# Patient Record
Sex: Female | Born: 1960 | Race: White | Hispanic: No | Marital: Married | Smoking: Never smoker
Health system: Southern US, Community
[De-identification: ages and names within clinical notes are randomized; demographics above are authoritative.]

## PROBLEM LIST (undated history)

## (undated) DIAGNOSIS — E538 Deficiency of other specified B group vitamins: Secondary | ICD-10-CM

## (undated) DIAGNOSIS — E039 Hypothyroidism, unspecified: Secondary | ICD-10-CM

## (undated) DIAGNOSIS — R569 Unspecified convulsions: Secondary | ICD-10-CM

## (undated) DIAGNOSIS — F419 Anxiety disorder, unspecified: Secondary | ICD-10-CM

## (undated) HISTORY — PX: COLONOSCOPY: SHX174

## (undated) HISTORY — PX: OTHER SURGICAL HISTORY: SHX169

## (undated) HISTORY — PX: WISDOM TOOTH EXTRACTION: SHX21

---

## 2015-10-30 ENCOUNTER — Other Ambulatory Visit: Payer: Self-pay | Admitting: Obstetrics and Gynecology

## 2015-10-30 DIAGNOSIS — Z1231 Encounter for screening mammogram for malignant neoplasm of breast: Secondary | ICD-10-CM

## 2015-11-09 ENCOUNTER — Ambulatory Visit: Payer: Self-pay | Attending: Obstetrics and Gynecology

## 2015-12-05 ENCOUNTER — Ambulatory Visit: Payer: Self-pay | Attending: Obstetrics and Gynecology

## 2016-01-10 ENCOUNTER — Ambulatory Visit: Payer: Commercial Managed Care - HMO | Admitting: Certified Registered Nurse Anesthetist

## 2016-01-10 ENCOUNTER — Encounter: Payer: Self-pay | Admitting: *Deleted

## 2016-01-10 ENCOUNTER — Encounter
Admission: RE | Disposition: A | Discharge status: Home / Self Care | Payer: Self-pay | Source: Ambulatory Visit | Attending: Unknown Physician Specialty

## 2016-01-10 ENCOUNTER — Ambulatory Visit
Admission: RE | Admit: 2016-01-10 | Discharge: 2016-01-10 | Disposition: A | Discharge status: Home / Self Care | Payer: Commercial Managed Care - HMO | Source: Ambulatory Visit | Attending: Unknown Physician Specialty | Admitting: Unknown Physician Specialty

## 2016-01-10 DIAGNOSIS — Z1211 Encounter for screening for malignant neoplasm of colon: Secondary | ICD-10-CM | POA: Insufficient documentation

## 2016-01-10 DIAGNOSIS — K5730 Diverticulosis of large intestine without perforation or abscess without bleeding: Secondary | ICD-10-CM | POA: Diagnosis not present

## 2016-01-10 DIAGNOSIS — E538 Deficiency of other specified B group vitamins: Secondary | ICD-10-CM | POA: Diagnosis not present

## 2016-01-10 DIAGNOSIS — K640 First degree hemorrhoids: Secondary | ICD-10-CM | POA: Insufficient documentation

## 2016-01-10 DIAGNOSIS — F419 Anxiety disorder, unspecified: Secondary | ICD-10-CM | POA: Insufficient documentation

## 2016-01-10 DIAGNOSIS — E039 Hypothyroidism, unspecified: Secondary | ICD-10-CM | POA: Insufficient documentation

## 2016-01-10 DIAGNOSIS — Z8371 Family history of colonic polyps: Secondary | ICD-10-CM | POA: Insufficient documentation

## 2016-01-10 DIAGNOSIS — Z79899 Other long term (current) drug therapy: Secondary | ICD-10-CM | POA: Diagnosis not present

## 2016-01-10 HISTORY — PX: COLONOSCOPY WITH PROPOFOL: SHX5780

## 2016-01-10 HISTORY — DX: Unspecified convulsions: R56.9

## 2016-01-10 HISTORY — DX: Anxiety disorder, unspecified: F41.9

## 2016-01-10 HISTORY — DX: Deficiency of other specified B group vitamins: E53.8

## 2016-01-10 HISTORY — DX: Hypothyroidism, unspecified: E03.9

## 2016-01-10 SURGERY — COLONOSCOPY WITH PROPOFOL
Anesthesia: General

## 2016-01-10 MED ORDER — SODIUM CHLORIDE 0.9 % IV SOLN: INTRAVENOUS | Status: DC

## 2016-01-10 MED ORDER — LIDOCAINE HCL (PF) 2 % IJ SOLN
INTRAMUSCULAR | Status: DC | PRN
  Administered 2016-01-10: 50 mg

## 2016-01-10 MED ORDER — FENTANYL CITRATE (PF) 100 MCG/2ML IJ SOLN
INTRAMUSCULAR | Status: DC | PRN
  Administered 2016-01-10: 50 ug via INTRAVENOUS

## 2016-01-10 MED ORDER — PROPOFOL 10 MG/ML IV BOLUS
INTRAVENOUS | Status: DC | PRN
  Administered 2016-01-10: 50 mg via INTRAVENOUS

## 2016-01-10 MED ORDER — MIDAZOLAM HCL 5 MG/5ML IJ SOLN
INTRAMUSCULAR | Status: DC | PRN
  Administered 2016-01-10: 1 mg via INTRAVENOUS

## 2016-01-10 MED ORDER — PROPOFOL 500 MG/50ML IV EMUL
INTRAVENOUS | Status: DC | PRN
  Administered 2016-01-10: 100 ug/kg/min via INTRAVENOUS

## 2016-01-10 MED ORDER — SODIUM CHLORIDE 0.9 % IV SOLN
INTRAVENOUS | Status: DC
  Administered 2016-01-10: 1000 mL via INTRAVENOUS
  Administered 2016-01-10: 11:00:00 via INTRAVENOUS

## 2016-01-10 NOTE — Transfer of Care (Signed)
Immediate Anesthesia Transfer of Care Note  Patient: Jackie Fleming  Procedure(s) Performed: Procedure(s): COLONOSCOPY WITH PROPOFOL (N/A)  Patient Location: Endoscopy Unit  Anesthesia Type:General  Level of Consciousness: awake and alert   Airway & Oxygen Therapy: Patient Spontanous Breathing and Patient connected to nasal cannula oxygen  Post-op Assessment: Report given to RN and Post -op Vital signs reviewed and stable  Post vital signs: Reviewed  Last Vitals:  Vitals:   01/10/16 0940 01/10/16 1128  BP: 108/74 102/60  Pulse: 71 61  Resp: 18 18  Temp: 36.8 C (!) 36 C    Last Pain:  Vitals:   01/10/16 0940  TempSrc: Tympanic         Complications: No apparent anesthesia complications

## 2016-01-10 NOTE — Anesthesia Preprocedure Evaluation (Signed)
Anesthesia Evaluation  Patient identified by MRN, date of birth, ID band Patient awake    Reviewed: Allergy & Precautions, H&P , NPO status , Patient's Chart, lab work & pertinent test results, reviewed documented beta blocker date and time   Airway Mallampati: II   Neck ROM: full    Dental  (+) Poor Dentition   Pulmonary neg pulmonary ROS,    Pulmonary exam normal        Cardiovascular negative cardio ROS Normal cardiovascular exam     Neuro/Psych Seizures -, Well Controlled,  negative neurological ROS  negative psych ROS   GI/Hepatic negative GI ROS, Neg liver ROS,   Endo/Other  negative endocrine ROSHypothyroidism   Renal/GU negative Renal ROS  negative genitourinary   Musculoskeletal   Abdominal   Peds  Hematology negative hematology ROS (+)   Anesthesia Other Findings Past Medical History: No date: Anxiety No date: B12 deficiency No date: Hypothyroidism No date: Seizures [facility]) Past Surgical History: No date: COLONOSCOPY No date: eye lift BMI    Body Mass Index:  22.31 kg/m     Reproductive/Obstetrics                             Anesthesia Physical Anesthesia Plan  ASA: II  Anesthesia Plan: General   Post-op Pain Management:    Induction:   Airway Management Planned:   Additional Equipment:   Intra-op Plan:   Post-operative Plan:   Informed Consent: I have reviewed the patients History and Physical, chart, labs and discussed the procedure including the risks, benefits and alternatives for the proposed anesthesia with the patient or authorized representative who has indicated his/her understanding and acceptance.   Dental Advisory Given  Plan Discussed with: CRNA  Anesthesia Plan Comments:         Anesthesia Quick Evaluation

## 2016-01-10 NOTE — Op Note (Signed)
[facility] Gastroenterology Patient Name: Jackie Fleming Procedure Date: 01/10/2016 10:58 AM MRN: [id number] Account #: [id number] Date of Birth: 1961/01/27 Admit Type: Outpatient Age: 55 Room: [facility] ENDO ROOM 4 Gender: Female Note Status: Finalized Procedure:            Colonoscopy Indications:          Screening for colorectal malignant neoplasm, Colon                        cancer screening in patient at increased risk: Family                        history of 1st-degree relative with colon polyps Providers:            Manya Silvas, MD Referring MD:         Ramonita Lab, MD (Referring MD) Medicines:            Propofol per Anesthesia Complications:        No immediate complications. Procedure:            Pre-Anesthesia Assessment:                       - After reviewing the risks and benefits, the patient                        was deemed in satisfactory condition to undergo the                        procedure.                       After obtaining informed consent, the colonoscope was                        passed under direct vision. Throughout the procedure,                        the patient's blood pressure, pulse, and oxygen                        saturations were monitored continuously. The                        Colonoscope was introduced through the anus and                        advanced to the the cecum, identified by appendiceal                        orifice and ileocecal valve. The colonoscopy was                        performed without difficulty. The patient tolerated the                        procedure well. The quality of the bowel preparation                        was excellent. Findings:      A single small-mouthed diverticulum was found in the proximal sigmoid  colon.      Internal hemorrhoids were found during endoscopy. The hemorrhoids were       small, medium-sized and Grade I (internal hemorrhoids that do not   prolapse).      The exam was otherwise without abnormality. Impression:           - Diverticulosis in the proximal sigmoid colon.                       - Internal hemorrhoids.                       - The examination was otherwise normal.                       - No specimens collected. Recommendation:       - Repeat colonoscopy in 5 years for screening purposes.                        Due to family history. Manya Silvas, MD 01/10/2016 11:29:23 AM This report has been signed electronically. Number of Addenda: 0 Note Initiated On: 01/10/2016 10:58 AM Scope Withdrawal Time: 0 hours 11 minutes 52 seconds  Total Procedure Duration: 0 hours 19 minutes 7 seconds       [facility]

## 2016-01-10 NOTE — H&P (Signed)
   Primary Care Physician:  Adin Hector, MD Primary Gastroenterologist:  Dr. Vira Agar  Pre-Procedure History & Physical: HPI:  Jackie Fleming is a 55 y.o. female is here for an colonoscopy.   Past Medical History:  Diagnosis Date  . Anxiety   . B12 deficiency   . Hypothyroidism   . Seizures ([place])     Past Surgical History:  Procedure Laterality Date  . COLONOSCOPY    . eye lift      Prior to Admission medications   Medication Sig Start Date End Date Taking? Authorizing Provider  atorvastatin (LIPITOR) 20 MG tablet Take 20 mg by mouth daily.   Yes Historical Provider, MD  cholecalciferol (VITAMIN D) 1000 units tablet Take 1,000 Units by mouth daily.   Yes Historical Provider, MD  escitalopram (LEXAPRO) 20 MG tablet Take 20 mg by mouth daily.   Yes Historical Provider, MD  estradiol (MINIVELLE) 0.025 MG/24HR Place 1 patch onto the skin 2 (two) times a week.   Yes Historical Provider, MD  levothyroxine (SYNTHROID, LEVOTHROID) 75 MCG tablet Take 75 mcg by mouth daily before breakfast.   Yes Historical Provider, MD  vitamin B-12 (CYANOCOBALAMIN) 100 MCG tablet Take 100 mcg by mouth daily.   Yes Historical Provider, MD    Allergies as of 01/08/2016  . (Not on File)    History reviewed. No pertinent family history.  Social History   Social History  . Marital status: Married    Spouse name: N/A  . Number of children: N/A  . Years of education: N/A   Occupational History  . Not on file.   Social History Main Topics  . Smoking status: Never Smoker  . Smokeless tobacco: Never Used  . Alcohol use No  . Drug use: No  . Sexual activity: Not on file   Other Topics Concern  . Not on file   Social History Narrative  . No narrative on file    Review of Systems: See HPI, otherwise negative ROS  Physical Exam: BP 108/74   Pulse 71   Temp 98.3 F (36.8 C) (Tympanic)   Resp 18   Ht 5\' 4"  (1.626 m)   Wt 59 kg (130 lb)   SpO2 98%   BMI 22.31 kg/m  General:    Alert,  pleasant and cooperative in NAD Head:  Normocephalic and atraumatic. Neck:  Supple; no masses or thyromegaly. Lungs:  Clear throughout to auscultation.    Heart:  Regular rate and rhythm. Abdomen:  Soft, nontender and nondistended. Normal bowel sounds, without guarding, and without rebound.   Neurologic:  Alert and  oriented x4;  grossly normal neurologically.  Impression/Plan: Jackie Fleming is here for an colonoscopy to be performed for screening FH colon polyps in father.  Risks, benefits, limitations, and alternatives regarding  colonoscopy have been reviewed with the patient.  Questions have been answered.  All parties agreeable.   Gaylyn Cheers, MD  01/10/2016, 11:00 AM

## 2016-01-10 NOTE — Anesthesia Postprocedure Evaluation (Signed)
Anesthesia Post Note  Patient: Jackie Fleming  Procedure(s) Performed: Procedure(s) (LRB): COLONOSCOPY WITH PROPOFOL (N/A)  Patient location during evaluation: PACU Anesthesia Type: General Level of consciousness: awake and alert Pain management: pain level controlled Vital Signs Assessment: post-procedure vital signs reviewed and stable Respiratory status: spontaneous breathing, nonlabored ventilation, respiratory function stable and patient connected to nasal cannula oxygen Cardiovascular status: blood pressure returned to baseline and stable Postop Assessment: no signs of nausea or vomiting Anesthetic complications: no    Last Vitals:  Vitals:   01/10/16 1130 01/10/16 1140  BP: 102/60 101/69  Pulse: (!) 57 (!) 52  Resp: 13 19  Temp:      Last Pain:  Vitals:   01/10/16 0940  TempSrc: Tympanic                 Eeshan Verbrugge

## 2016-01-11 ENCOUNTER — Encounter: Payer: Self-pay | Admitting: Unknown Physician Specialty

## 2016-08-13 ENCOUNTER — Other Ambulatory Visit: Payer: Self-pay | Admitting: Internal Medicine

## 2016-08-13 DIAGNOSIS — Z1231 Encounter for screening mammogram for malignant neoplasm of breast: Secondary | ICD-10-CM

## 2016-09-19 ENCOUNTER — Ambulatory Visit
Admission: RE | Admit: 2016-09-19 | Discharge: 2016-09-19 | Disposition: A | Discharge status: Home / Self Care | Payer: 59 | Source: Ambulatory Visit | Attending: Internal Medicine | Admitting: Internal Medicine

## 2016-09-19 DIAGNOSIS — Z1231 Encounter for screening mammogram for malignant neoplasm of breast: Secondary | ICD-10-CM

## 2016-09-30 ENCOUNTER — Other Ambulatory Visit: Payer: Self-pay | Admitting: *Deleted

## 2016-09-30 ENCOUNTER — Inpatient Hospital Stay
Admission: RE | Admit: 2016-09-30 | Discharge: 2016-09-30 | Disposition: A | Discharge status: Home / Self Care | Payer: Self-pay | Source: Ambulatory Visit | Attending: *Deleted | Admitting: *Deleted

## 2016-09-30 DIAGNOSIS — Z9289 Personal history of other medical treatment: Secondary | ICD-10-CM

## 2016-11-27 ENCOUNTER — Telehealth: Payer: Self-pay

## 2016-11-27 NOTE — Telephone Encounter (Signed)
Pt needs an annual exam. Once scheduled and seen we can refill medication. KJ CMA

## 2016-11-27 NOTE — Telephone Encounter (Signed)
Pt would like homrone rx c four month supply as she usually just uses it during the hot summer months to control hot flashes.  [phone]

## 2016-11-29 ENCOUNTER — Other Ambulatory Visit: Payer: Self-pay | Admitting: Obstetrics and Gynecology

## 2016-11-29 MED ORDER — ESTRADIOL 0.025 MG/24HR TD PTTW: 1.0000 | MEDICATED_PATCH | TRANSDERMAL | 4 refills | Status: DC

## 2016-11-29 NOTE — Telephone Encounter (Signed)
Pt is schedule 12/30/16. Pt uses [employer].

## 2016-11-29 NOTE — Telephone Encounter (Signed)
Please advise 

## 2016-11-29 NOTE — Telephone Encounter (Signed)
I have her down as being on the patch in epic and tablets in [place]/  I've sent rx for patch

## 2016-12-30 ENCOUNTER — Ambulatory Visit (INDEPENDENT_AMBULATORY_CARE_PROVIDER_SITE_OTHER): Payer: 59 | Admitting: Obstetrics and Gynecology

## 2016-12-30 ENCOUNTER — Encounter: Payer: Self-pay | Admitting: Obstetrics and Gynecology

## 2016-12-30 DIAGNOSIS — Z1239 Encounter for other screening for malignant neoplasm of breast: Secondary | ICD-10-CM

## 2016-12-30 DIAGNOSIS — Z01419 Encounter for gynecological examination (general) (routine) without abnormal findings: Secondary | ICD-10-CM | POA: Diagnosis not present

## 2016-12-30 DIAGNOSIS — Z124 Encounter for screening for malignant neoplasm of cervix: Secondary | ICD-10-CM

## 2016-12-30 DIAGNOSIS — Z1231 Encounter for screening mammogram for malignant neoplasm of breast: Secondary | ICD-10-CM

## 2016-12-30 MED ORDER — ESTRADIOL 0.025 MG/24HR TD PTTW: 1.0000 | MEDICATED_PATCH | TRANSDERMAL | 11 refills | Status: DC

## 2016-12-30 NOTE — Progress Notes (Signed)
Gynecology Annual Exam  PCP: Adin Hector, MD  Chief Complaint:  Chief Complaint  Patient presents with  . Gynecologic Exam    History of Present Illness:Patient is a 56 y.o. No obstetric history on file. presents for annual exam. The patient has no complaints today.   LMP: No LMP recorded. Patient is postmenopausal. 56 y.o.  The patient is sexually active. She denies dyspareunia.  The patient does perform self breast exams.  There is no notable family history of breast or ovarian cancer in her family.  The patient wears seatbelts: yes.   The patient has regular exercise: not asked.    The patient denies current symptoms of depression.     Review of Systems: Review of Systems  Constitutional: Negative for chills and fever.  HENT: Negative for congestion.   Respiratory: Negative for cough and shortness of breath.   Cardiovascular: Negative for chest pain and palpitations.  Gastrointestinal: Negative for abdominal pain, constipation, diarrhea, heartburn, nausea and vomiting.  Genitourinary: Negative for dysuria, frequency and urgency.  Skin: Negative for itching and rash.  Neurological: Negative for dizziness and headaches.  Endo/Heme/Allergies: Negative for polydipsia.  Psychiatric/Behavioral: Negative for depression.    Past Medical History:  Past Medical History:  Diagnosis Date  . Anxiety   . B12 deficiency   . Hypothyroidism   . Seizures ([place])     Past Surgical History:  Past Surgical History:  Procedure Laterality Date  . COLONOSCOPY    . COLONOSCOPY WITH PROPOFOL N/A 01/10/2016   Procedure: COLONOSCOPY WITH PROPOFOL;  Surgeon: Manya Silvas, MD;  Location[facility] ENDOSCOPY;  Service: Endoscopy;  Laterality: N/A;  . eye lift      Gynecologic History:  No LMP recorded. Patient is postmenopausal. 56 y.o. Last Pap: Results were:NIL and HR HPV negative 06/29/2013 Last mammogram: 09/19/2016 Results were: BI-RAD I Obstetric History: No obstetric history on  file.  Family History:  Family History  Problem Relation Age of Onset  . Breast cancer Neg Hx     Social History:  Social History   Social History  . Marital status: Married    Spouse name: N/A  . Number of children: N/A  . Years of education: N/A   Occupational History  . Not on file.   Social History Main Topics  . Smoking status: Never Smoker  . Smokeless tobacco: Never Used  . Alcohol use No  . Drug use: No  . Sexual activity: Not on file   Other Topics Concern  . Not on file   Social History Narrative  . No narrative on file    Allergies:  No Known Allergies  Medications: Prior to Admission medications   Medication Sig Start Date End Date Taking? Authorizing Provider  atorvastatin (LIPITOR) 20 MG tablet Take 20 mg by mouth daily.   Yes [provider]  cholecalciferol (VITAMIN D) 1000 units tablet Take 1,000 Units by mouth daily.   Yes [provider]  escitalopram (LEXAPRO) 10 MG tablet Take by mouth. 07/24/16  Yes [provider]  estradiol (MINIVELLE) 0.025 MG/24HR Place 1 patch onto the skin 2 (two) times a week. 12/02/16  Yes Malachy Mood, MD  levothyroxine (SYNTHROID, LEVOTHROID) 75 MCG tablet Take 75 mcg by mouth daily before breakfast.   Yes [provider]    Physical Exam Vitals: Blood pressure 100/66, pulse (!) 59, height 5\' 4"  (1.626 m), weight 134 lb (60.8 kg).  General: NAD HEENT: normocephalic, anicteric Thyroid: no enlargement, no palpable nodules Pulmonary: No  increased work of breathing, CTAB Cardiovascular: RRR, distal pulses 2+ Breast: Breast symmetrical, no tenderness, no palpable nodules or masses, no skin or nipple retraction present, no nipple discharge.  No axillary or supraclavicular lymphadenopathy. Abdomen: NABS, soft, non-tender, non-distended.  Umbilicus without lesions.  No hepatomegaly, splenomegaly or masses palpable. No evidence of hernia  Genitourinary:  External: Normal external  female genitalia.  Normal urethral meatus, normal  Bartholin's and Skene's glands.    Vagina: Normal vaginal mucosa, no evidence of prolapse.    Cervix: Grossly normal in appearance, no bleeding, IUD strings visualized  Uterus: Non-enlarged, mobile, normal contour.  No CMT  Adnexa: ovaries non-enlarged, no adnexal masses  Rectal: deferred  Lymphatic: no evidence of inguinal lymphadenopathy Extremities: no edema, erythema, or tenderness Neurologic: Grossly intact Psychiatric: mood appropriate, affect full  Female chaperone present for pelvic and breast  portions of the physical exam    Assessment: 56 y.o. routine annual exam  Plan: Problem List Items Addressed This Visit    None      1) Mammogram - recommend yearly screening mammogram.  Mammogram Is up to date  2) STI screening was not discussed  3) ASCCP guidelines and rational discussed.  Patient opts for every 3 years screening interval  4) Osteoporosis  - per USPTF routine screening DEXA at age 14  5) Routine healthcare maintenance including cholesterol, diabetes screening discussed managed by PCP - Kerrin Mo, MD  6) Colonoscopy - Screening recommended starting at age 59 for average risk individuals, age 54 for individuals deemed at increased risk (including African Americans) and recommended to continue until age 40.  For patient age 49-85 individualized approach is recommended.  Gold standard screening is via colonoscopy, Cologuard screening is an acceptable alternative for patient unwilling or unable to undergo colonoscopy.  "Colorectal cancer screening for average?risk adults: 2018 guideline update from the [place]: A Cancer Journal for Clinicians: Nov 06, 2016  - up to date 01/10/16 - [place] repeat in 5 years  7) Follow up 1 year for routine annual

## 2017-01-01 LAB — PAPIG, HPV, RFX 16/18
HPV, high-risk: NEGATIVE
PAP Smear Comment: 0

## 2017-01-03 ENCOUNTER — Encounter: Payer: Self-pay | Admitting: Obstetrics and Gynecology

## 2017-03-04 ENCOUNTER — Encounter: Payer: Self-pay | Admitting: Obstetrics and Gynecology

## 2017-03-04 ENCOUNTER — Ambulatory Visit (INDEPENDENT_AMBULATORY_CARE_PROVIDER_SITE_OTHER): Payer: No Typology Code available for payment source | Admitting: Obstetrics and Gynecology

## 2017-03-04 ENCOUNTER — Ambulatory Visit: Payer: 59 | Admitting: Obstetrics and Gynecology

## 2017-03-04 ENCOUNTER — Telehealth: Payer: Self-pay

## 2017-03-04 VITALS — BP 100/80 | HR 67 | Temp 99.3°F | Ht 64.0 in | Wt 134.0 lb

## 2017-03-04 DIAGNOSIS — R319 Hematuria, unspecified: Secondary | ICD-10-CM | POA: Diagnosis not present

## 2017-03-04 DIAGNOSIS — T8332XA Displacement of intrauterine contraceptive device, initial encounter: Secondary | ICD-10-CM

## 2017-03-04 DIAGNOSIS — Z30432 Encounter for removal of intrauterine contraceptive device: Secondary | ICD-10-CM | POA: Diagnosis not present

## 2017-03-04 DIAGNOSIS — R3989 Other symptoms and signs involving the genitourinary system: Secondary | ICD-10-CM | POA: Diagnosis not present

## 2017-03-04 DIAGNOSIS — B952 Enterococcus as the cause of diseases classified elsewhere: Secondary | ICD-10-CM

## 2017-03-04 DIAGNOSIS — N390 Urinary tract infection, site not specified: Secondary | ICD-10-CM

## 2017-03-04 LAB — POCT URINALYSIS DIPSTICK
BILIRUBIN UA: NEGATIVE
GLUCOSE UA: NEGATIVE
Ketones, UA: NEGATIVE
NITRITE UA: NEGATIVE
Spec Grav, UA: 1.02 (ref 1.010–1.025)
UROBILINOGEN UA: NEGATIVE U/dL — AB
pH, UA: 8 (ref 5.0–8.0)

## 2017-03-04 MED ORDER — NYSTATIN 100000 UNIT/GM EX CREA: 1.0000 "application " | TOPICAL_CREAM | Freq: Two times a day (BID) | CUTANEOUS | 1 refills | Status: DC

## 2017-03-04 MED ORDER — NITROFURANTOIN MONOHYD MACRO 100 MG PO CAPS
100.0000 mg | ORAL_CAPSULE | Freq: Two times a day (BID) | ORAL | 1 refills | Status: DC
Start: 2017-03-04 — End: 2018-04-07

## 2017-03-04 NOTE — Progress Notes (Signed)
Obstetrics & Gynecology Office Visit   Chief Complaint:  Chief Complaint  Patient presents with  . vaginal pressure    bladder pressure/faint blood yesterday    History of Present Illness: [name] is a 56 y.o. G2P2 who LMP was No LMP recorded. Patient is postmenopausal., presents today for a problem visit.  She complains of dysuria and suprapubic pressure . She has had symptoms for 1 weeks. Patient also complains of noted small amount of blood after urination. Symptoms are moderate.  Patient denies fever and vaginal discharge. Patient does not have a history of recurrent UTI,  does not have a history of pyelonephritis, does not have a history of nephrolithiasis.  She has not had previous treatment for her current symptoms.   Review of Systems: 10 point review of systems negative unless otherwise noted in HPI  Past Medical History:  Past Medical History:  Diagnosis Date  . Anxiety   . B12 deficiency   . Hypothyroidism   . Seizures ([place])     Past Surgical History:  Past Surgical History:  Procedure Laterality Date  . COLONOSCOPY    . COLONOSCOPY WITH PROPOFOL N/A 01/10/2016   Procedure: COLONOSCOPY WITH PROPOFOL;  Surgeon: Manya Silvas, MD;  Location: [facility] ENDOSCOPY;  Service: Endoscopy;  Laterality: N/A;  . eye lift      Gynecologic History: No LMP recorded. Patient is postmenopausal.  Obstetric History: G2P2  Family History:  Family History  Problem Relation Age of Onset  . Breast cancer Neg Hx     Social History:  Social History   Social History  . Marital status: Married    Spouse name: N/A  . Number of children: N/A  . Years of education: N/A   Occupational History  . Not on file.   Social History Main Topics  . Smoking status: Never Smoker  . Smokeless tobacco: Never Used  . Alcohol use No  . Drug use: No  . Sexual activity: Yes    Birth control/ protection: None   Other Topics Concern  . Not on file   Social History Narrative    . No narrative on file    Allergies:  No Known Allergies  Medications: Prior to Admission medications   Medication Sig Start Date End Date Taking? Authorizing Provider  atorvastatin (LIPITOR) 20 MG tablet Take 20 mg by mouth daily.   Yes [provider]  cholecalciferol (VITAMIN D) 1000 units tablet Take 1,000 Units by mouth daily.   Yes [provider]  escitalopram (LEXAPRO) 10 MG tablet Take by mouth. 07/24/16  Yes [provider]  levothyroxine (SYNTHROID, LEVOTHROID) 75 MCG tablet Take 75 mcg by mouth daily before breakfast.   Yes [provider]  nitrofurantoin, macrocrystal-monohydrate, (MACROBID) 100 MG capsule Take 1 capsule (100 mg total) by mouth 2 (two) times daily. 03/04/17   Malachy Mood, MD  nystatin cream (MYCOSTATIN) Apply 1 application topically 2 (two) times daily. 03/04/17   Malachy Mood, MD    Physical Exam Vitals:  Vitals:   03/04/17 1611  BP: 100/80  Pulse: 67  Temp: 99.3 F (37.4 C)   No LMP recorded. Patient is postmenopausal.  General: NAD HEENT: normocephalic, anicteric Pulmonary: No increased work of breathing  Genitourinary:  External: Normal external female genitalia.  Normal urethral meatus, normal  Bartholin's and Skene's glands.    Vagina: Normal vaginal mucosa, no evidence of prolapse.    Cervix: Grossly normal in appearance, no bleeding, IUD strings are not  visualized  Uterus: Non-enlarged, mobile, normal contour.  No CMT  Adnexa: ovaries non-enlarged, no adnexal masses  Rectal: deferred  Lymphatic: no evidence of inguinal lymphadenopathy Extremities: no edema, erythema, or tenderness Neurologic: Grossly intact Psychiatric: mood appropriate, affect full  Female chaperone present for pelvic  portions of the physical exam  Assessment: 56 y.o. G2P2 with UTI, inability to visualized IUD strings  Plan: Problem List Items Addressed This Visit    None    Visit Diagnoses    Sensation of  pressure in bladder area    -  Primary   Relevant Orders   POCT urinalysis dipstick (Completed)   Urinary tract infection with hematuria, site unspecified       Relevant Medications   nitrofurantoin, macrocrystal-monohydrate, (MACROBID) 100 MG capsule   nystatin cream (MYCOSTATIN)   Other Relevant Orders   POCT urinalysis dipstick (Completed)   UTI (urinary tract infection) due to Enterococcus       Relevant Medications   nitrofurantoin, macrocrystal-monohydrate, (MACROBID) 100 MG capsule   nystatin cream (MYCOSTATIN)   Other Relevant Orders   Urine Culture   Encounter for IUD removal       Displacement of intrauterine contraceptive device, initial encounter       Relevant Orders   US PELVIS TRANSVANGINAL NON-OB (TV ONLY)      No longer taking estrogen patch IUD string NOT visualized follow up US in 1 week, given of HRT now and 55 reasonable to discontinue IUD rx macrobid for uti Diflucan if yeast infection A total of 15 minutes were spent in face-to-face contact with the patient during this encounter with over half of that time devoted to counseling and coordination of care.

## 2017-03-04 NOTE — Telephone Encounter (Signed)
Pt called triage line stating she is experiencing vaginal pressure since Saturday. She feels like she needs to push a baby out, today she noticed pink tinged  Blood. She wants to know what to do. She is leaving out of town on Thursday. CB# [phone]  Pt is coming in today at 4:10pm as a work-in with AMS.

## 2017-03-08 LAB — URINE CULTURE

## 2017-03-10 ENCOUNTER — Encounter: Payer: Self-pay | Admitting: Obstetrics and Gynecology

## 2017-03-11 ENCOUNTER — Ambulatory Visit (INDEPENDENT_AMBULATORY_CARE_PROVIDER_SITE_OTHER): Payer: No Typology Code available for payment source

## 2017-03-11 ENCOUNTER — Ambulatory Visit (INDEPENDENT_AMBULATORY_CARE_PROVIDER_SITE_OTHER): Payer: No Typology Code available for payment source | Admitting: Obstetrics and Gynecology

## 2017-03-11 ENCOUNTER — Encounter: Payer: Self-pay | Admitting: Obstetrics and Gynecology

## 2017-03-11 VITALS — BP 102/82 | Wt 134.0 lb

## 2017-03-11 DIAGNOSIS — D251 Intramural leiomyoma of uterus: Secondary | ICD-10-CM | POA: Diagnosis not present

## 2017-03-11 DIAGNOSIS — Z30432 Encounter for removal of intrauterine contraceptive device: Secondary | ICD-10-CM | POA: Diagnosis not present

## 2017-03-11 DIAGNOSIS — T8332XA Displacement of intrauterine contraceptive device, initial encounter: Secondary | ICD-10-CM | POA: Diagnosis not present

## 2017-03-12 NOTE — Progress Notes (Signed)
Gynecology Ultrasound Follow Up  Chief Complaint:  Chief Complaint  Patient presents with  . U/S follow up    IUD location/removal     History of Present Illness: Patient is a 56 y.o. female who presents today for ultrasound evaluation of IUD with string no visualized.  Ultrasound demonstrates the following findgins Adnexa: bilateral adnexa imaged and normal in appearance Uterus: There are two fundal fibroid one left fundal one posterior fundal, the posterior fibroid does appear to be encroaching on the endometrial cavity and have caused IUD string to pull up but these are visualized just past the external cervical os Additional: no free fluid  Patient has finished out the antibiotic course for E. Coli UTI.  She has had complete resolution of her urinary and pelvic symptoms following her treatment course.    Review of Systems: Review of Systems  Constitutional: Negative for chills and fever.  HENT: Negative for congestion.   Respiratory: Negative for cough and shortness of breath.   Cardiovascular: Negative for chest pain and palpitations.  Gastrointestinal: Negative for abdominal pain, constipation, diarrhea, heartburn, nausea and vomiting.  Genitourinary: Negative for dysuria, frequency and urgency.  Skin: Negative for itching and rash.  Neurological: Negative for dizziness and headaches.  Endo/Heme/Allergies: Negative for polydipsia.  Psychiatric/Behavioral: Negative for depression.    Past Medical History:  Past Medical History:  Diagnosis Date  . Anxiety   . B12 deficiency   . Hypothyroidism   . Seizures ([place])     Past Surgical History:  Past Surgical History:  Procedure Laterality Date  . COLONOSCOPY    . COLONOSCOPY WITH PROPOFOL N/A 01/10/2016   Procedure: COLONOSCOPY WITH PROPOFOL;  Surgeon: Manya Silvas, MD;  Location: [facility] ENDOSCOPY;  Service: Endoscopy;  Laterality: N/A;  . eye lift      Gynecologic History:  No LMP recorded. Patient is  postmenopausal.   Family History:  Family History  Problem Relation Age of Onset  . Breast cancer Neg Hx     Social History:  Social History   Social History  . Marital status: Married    Spouse name: N/A  . Number of children: N/A  . Years of education: N/A   Occupational History  . Not on file.   Social History Main Topics  . Smoking status: Never Smoker  . Smokeless tobacco: Never Used  . Alcohol use No  . Drug use: No  . Sexual activity: Yes    Birth control/ protection: None   Other Topics Concern  . Not on file   Social History Narrative  . No narrative on file    Allergies:  No Known Allergies  Medications: Prior to Admission medications   Medication Sig Start Date End Date Taking? Authorizing Provider  atorvastatin (LIPITOR) 20 MG tablet Take 20 mg by mouth daily.    [provider]  cholecalciferol (VITAMIN D) 1000 units tablet Take 1,000 Units by mouth daily.    [provider]  escitalopram (LEXAPRO) 10 MG tablet Take by mouth. 07/24/16   [provider]  levothyroxine (SYNTHROID, LEVOTHROID) 75 MCG tablet Take 75 mcg by mouth daily before breakfast.    [provider]  nitrofurantoin, macrocrystal-monohydrate, (MACROBID) 100 MG capsule Take 1 capsule (100 mg total) by mouth 2 (two) times daily. 03/04/17   Malachy Mood, MD  nystatin cream (MYCOSTATIN) Apply 1 application topically 2 (two) times daily. 03/04/17   Malachy Mood, MD    Physical Exam Vitals: Blood pressure 102/82, weight 134 lb (  60.8 kg).  General: NAD HEENT: normocephalic, anicteric Pulmonary: No increased work of breathing Extremities: no edema, erythema, or tenderness Neurologic: Grossly intact, normal gait Psychiatric: mood appropriate, affect full   GYNECOLOGY OFFICE PROCEDURE NOTE  Jackie Fleming is a 56 y.o. G2P2 here for  IUD removal. She desires removal secondary to menopausal status.  IUD Removal  Patient identified, informed  consent performed, consent signed.  Patient was in the dorsal lithotomy position, normal external genitalia was noted.  A speculum was placed in the patient's vagina, normal discharge was noted, no lesions. The cervix was visualized, no lesions, no abnormal discharge.  The strings of the IUD were not visualized, so Kelly forceps were introduced into the endometrial cavity and the IUD was grasped and removed in its entirety.  Patient tolerated the procedure well.     Assessment: 56 y.o. G2P2 No problem-specific Assessment & Plan notes found for this encounter.   Plan: Problem List Items Addressed This Visit    None    Visit Diagnoses    Intramural leiomyoma of uterus    -  Primary   Relevant Orders   US PELVIS TRANSVANGINAL NON-OB (TV ONLY)   Encounter for IUD removal          1) IUD removed without complications.  Suspect menopausal so will follow up on symptoms, should she have repeat bleeding would warrant further discussion as to management options given visualization of two fibroids  2) Uterine leiomyoma - not previously documented.  We discussed that these are relatively common with about 50% of women showing evidence of fibroids, tend to decrease in size once menopause is reached, and unless causing mass effect do not require additional interventions.  We discussed rare incidence of leiomyosarcoma.  We will obtain a follow up ultrasound in 6 months to document stability in size.  3) A total of 15 minutes were spent in face-to-face contact with the patient during this encounter with over half of that time devoted to counseling and coordination of care.

## 2017-08-06 ENCOUNTER — Other Ambulatory Visit: Payer: Self-pay | Admitting: Internal Medicine

## 2017-09-23 ENCOUNTER — Other Ambulatory Visit: Payer: Self-pay | Admitting: Obstetrics and Gynecology

## 2017-09-23 DIAGNOSIS — Z1231 Encounter for screening mammogram for malignant neoplasm of breast: Secondary | ICD-10-CM

## 2017-10-09 ENCOUNTER — Ambulatory Visit
Admission: RE | Admit: 2017-10-09 | Discharge: 2017-10-09 | Disposition: A | Discharge status: Home / Self Care | Payer: Self-pay | Source: Ambulatory Visit | Attending: Obstetrics and Gynecology | Admitting: Obstetrics and Gynecology

## 2017-10-09 DIAGNOSIS — Z1231 Encounter for screening mammogram for malignant neoplasm of breast: Secondary | ICD-10-CM | POA: Insufficient documentation

## 2017-10-13 ENCOUNTER — Encounter (INDEPENDENT_AMBULATORY_CARE_PROVIDER_SITE_OTHER): Payer: Self-pay

## 2018-04-06 ENCOUNTER — Telehealth: Payer: Self-pay

## 2018-04-06 NOTE — Telephone Encounter (Signed)
Pt is calling triage stating she feels like her UTI has come back pt stating her symptoms are "burning, and feels like I have to go pee pee every 20 mins but I dont" please advise. Thank you!

## 2018-04-07 ENCOUNTER — Encounter: Payer: Self-pay | Admitting: Obstetrics and Gynecology

## 2018-04-07 ENCOUNTER — Ambulatory Visit (INDEPENDENT_AMBULATORY_CARE_PROVIDER_SITE_OTHER): Payer: Self-pay | Admitting: Obstetrics and Gynecology

## 2018-04-07 VITALS — BP 108/60 | HR 67 | Ht 63.0 in | Wt 133.0 lb

## 2018-04-07 DIAGNOSIS — D219 Benign neoplasm of connective and other soft tissue, unspecified: Secondary | ICD-10-CM

## 2018-04-07 DIAGNOSIS — N3001 Acute cystitis with hematuria: Secondary | ICD-10-CM

## 2018-04-07 LAB — POCT URINALYSIS DIPSTICK
Bilirubin, UA: NEGATIVE
Glucose, UA: NEGATIVE
Ketones, UA: NEGATIVE
Nitrite, UA: NEGATIVE
PH UA: 6 (ref 5.0–8.0)
PROTEIN UA: NEGATIVE
SPEC GRAV UA: 1.01 (ref 1.010–1.025)

## 2018-04-07 MED ORDER — NITROFURANTOIN MONOHYD MACRO 100 MG PO CAPS: 100.0000 mg | ORAL_CAPSULE | Freq: Two times a day (BID) | ORAL | 0 refills | Status: AC

## 2018-04-07 NOTE — Progress Notes (Signed)
Adin Hector, MD   Chief Complaint  Patient presents with  . Urinary Tract Infection    discomfort, burning sensation when urinating, no blood or odor, and some frequency, started noticing these symptoms yesterday    HPI:      [name] is a 57 y.o. G2P2 who LMP was No LMP recorded. Patient is postmenopausal., presents today for UTI sx of urinary frequency/urgency/pelvic pressure and dysuria since yesterday. No hematuria/unusual LBP/fevers. No vag sx, no vag bleeding. Had UTI 9/18-- E. Coli, susc to macrobid.  [name] Pt without PMB, pelvic pain. Pt's husband concerned about cancer. Dr. Georgianne Fick suggested 6 mo u/s f/u for stability but pt's insurance isn't good, so she wondered if she could do f/u u/s 06/2018.    Past Medical History:  Diagnosis Date  . Anxiety   . B12 deficiency   . Hypothyroidism   . Seizures ([place])     Past Surgical History:  Procedure Laterality Date  . COLONOSCOPY    . COLONOSCOPY WITH PROPOFOL N/A 01/10/2016   Procedure: COLONOSCOPY WITH PROPOFOL;  Surgeon: Manya Silvas, MD;  Location: [facility] ENDOSCOPY;  Service: Endoscopy;  Laterality: N/A;  . eye lift    . WISDOM TOOTH EXTRACTION      Family History  Problem Relation Age of Onset  . Hypercholesterolemia Mother   . Melanoma Mother        Skin, malignant  . Alzheimer's disease Father   . Colon polyps Father   . Hypertension Father   . Stroke Father   . Breast cancer Neg Hx     Social History   Socioeconomic History  . Marital status: Married    Spouse name: Not on file  . Number of children: Not on file  . Years of education: Not on file  . Highest education level: Not on file  Occupational History  . Not on file  Social Needs  . Financial resource strain: Not on file  . Food insecurity:    Worry: Not on file    Inability: Not on file  . Transportation needs:    Medical: Not on file    Non-medical: Not on file   Tobacco Use  . Smoking status: Never Smoker  . Smokeless tobacco: Never Used  Substance and Sexual Activity  . Alcohol use: No  . Drug use: No  . Sexual activity: Yes    Birth control/protection: None  Lifestyle  . Physical activity:    Days per week: Not on file    Minutes per session: Not on file  . Stress: Not on file  Relationships  . Social connections:    Talks on phone: Not on file    Gets together: Not on file    Attends religious service: Not on file    Active member of club or organization: Not on file    Attends meetings of clubs or organizations: Not on file    Relationship status: Not on file  . Intimate partner violence:    Fear of current or ex partner: Not on file    Emotionally abused: Not on file    Physically abused: Not on file    Forced sexual activity: Not on file  Other Topics Concern  . Not on file  Social History Narrative  . Not on file    Outpatient Medications Prior to Visit  Medication Sig Dispense Refill  . atorvastatin (LIPITOR)  20 MG tablet Take 20 mg by mouth daily.    Marland Kitchen escitalopram (LEXAPRO) 10 MG tablet Take by mouth.    . levothyroxine (SYNTHROID, LEVOTHROID) 75 MCG tablet Take 75 mcg by mouth daily before breakfast.    . cholecalciferol (VITAMIN D) 1000 units tablet Take 1,000 Units by mouth daily.    . nitrofurantoin, macrocrystal-monohydrate, (MACROBID) 100 MG capsule Take 1 capsule (100 mg total) by mouth 2 (two) times daily. 14 capsule 1  . nystatin cream (MYCOSTATIN) Apply 1 application topically 2 (two) times daily. 30 g 1   No facility-administered medications prior to visit.      ROS:  Review of Systems  Constitutional: Negative for fever.  Gastrointestinal: Negative for blood in stool, constipation, diarrhea, nausea and vomiting.  Genitourinary: Positive for dysuria, frequency and urgency. Negative for dyspareunia, flank pain, hematuria, vaginal bleeding, vaginal discharge and vaginal pain.  Musculoskeletal: Positive  for back pain.  Skin: Negative for rash.    OBJECTIVE:   Vitals:  BP 108/60   Pulse 67   Ht 5\' 3"  (1.6 m)   Wt 133 lb (60.3 kg)   BMI 23.56 kg/m   Physical Exam  Constitutional: She is oriented to person, place, and time. She appears well-developed.  Neck: Normal range of motion.  Pulmonary/Chest: Effort normal.  Abdominal: There is no CVA tenderness.  Neurological: She is alert and oriented to person, place, and time. No cranial nerve deficit.  Psychiatric: She has a normal mood and affect. Her behavior is normal. Judgment and thought content normal.  Vitals reviewed.   Results: Results for orders placed or performed in visit on 04/07/18 (from the past 24 hour(s))  POCT Urinalysis Dipstick     Status: Abnormal   Collection Time: 04/07/18 10:28 AM  Result Value Ref Range   Color, UA yellow    Clarity, UA clear    Glucose, UA Negative Negative   Bilirubin, UA neg    Ketones, UA neg    Spec Grav, UA 1.010 1.010 - 1.025   Blood, UA small    pH, UA 6.0 5.0 - 8.0   Protein, UA Negative Negative   Urobilinogen, UA     Nitrite, UA neg    Leukocytes, UA Moderate (2+) (A) Negative   Appearance     Odor       Assessment/Plan: Acute cystitis with hematuria - Pos sx/UA. Check C&S. Rx macrobid. F/u prn.  - Plan: POCT Urinalysis Dipstick, Urine Culture, nitrofurantoin, macrocrystal-monohydrate, (MACROBID) 100 MG capsule  Leiomyoma - 5 small leio on 10/18 u/s. Pt without pelvic pain/PMB. Can follow up 1/20 wiht Dr. Georgianne Fick.    Meds ordered this encounter  Medications  . nitrofurantoin, macrocrystal-monohydrate, (MACROBID) 100 MG capsule    Sig: Take 1 capsule (100 mg total) by mouth 2 (two) times daily for 5 days.    Dispense:  10 capsule    Refill:  0    Order Specific Question:   Supervising Provider    Answer:   Gae Dry [[id number]]      Return if symptoms worsen or fail to improve.  Dierra Riesgo B. Dulcinea Kinser, PA-C 04/07/2018 10:30 AM

## 2018-04-07 NOTE — Telephone Encounter (Signed)
Completed in E/M note.

## 2018-04-07 NOTE — Telephone Encounter (Signed)
Pt will need to be seen. Please scheduled or can go to urgent care/PCP

## 2018-04-07 NOTE — Telephone Encounter (Signed)
Patient is schedule 04/07/18

## 2018-04-07 NOTE — Patient Instructions (Signed)
I value your feedback and entrusting us with your care. If you get a [facility] patient survey, I would appreciate you taking the time to let us know about your experience today. Thank you! 

## 2018-04-09 LAB — URINE CULTURE

## 2018-08-03 ENCOUNTER — Telehealth: Payer: Self-pay

## 2018-08-03 NOTE — Telephone Encounter (Signed)
Pt called after hour nurse 08/01/18 at 1:55pm c/o dx'd c flu A on the 21st and feels like she has UTI - frequency and urgency.  Pt stated she called her Internist and an antibs was sent in.

## 2018-08-26 ENCOUNTER — Other Ambulatory Visit: Payer: Self-pay | Admitting: Internal Medicine

## 2018-08-26 DIAGNOSIS — Z1231 Encounter for screening mammogram for malignant neoplasm of breast: Secondary | ICD-10-CM

## 2019-02-11 ENCOUNTER — Ambulatory Visit
Admission: RE | Admit: 2019-02-11 | Discharge: 2019-02-11 | Disposition: A | Discharge status: Home / Self Care | Payer: BC Managed Care – PPO | Source: Ambulatory Visit | Attending: Internal Medicine | Admitting: Internal Medicine

## 2019-02-11 DIAGNOSIS — Z1231 Encounter for screening mammogram for malignant neoplasm of breast: Secondary | ICD-10-CM | POA: Diagnosis not present

## 2019-02-16 NOTE — Telephone Encounter (Signed)
Patient is schedule for Thursday, 03/04/19 at 8:50

## 2019-02-16 NOTE — Telephone Encounter (Signed)
Can we put her in for a phone visit in the next 2 weeks to discuss

## 2019-03-04 ENCOUNTER — Telehealth: Payer: Self-pay | Admitting: Obstetrics and Gynecology

## 2019-03-04 ENCOUNTER — Other Ambulatory Visit: Payer: Self-pay

## 2019-03-04 ENCOUNTER — Other Ambulatory Visit: Payer: Self-pay | Admitting: Obstetrics and Gynecology

## 2019-03-04 ENCOUNTER — Ambulatory Visit: Payer: BC Managed Care – PPO | Admitting: Obstetrics and Gynecology

## 2019-03-04 DIAGNOSIS — D251 Intramural leiomyoma of uterus: Secondary | ICD-10-CM

## 2019-03-04 NOTE — Telephone Encounter (Signed)
Patient is calling to schedule annual on 03/29/19 at 2:00 for next available appointment and states she was suppose to have ultrasound to follow up on past ultrasound. Patient is requesting blood work at her appointment. Could you please place orders. Thank you

## 2019-03-04 NOTE — Telephone Encounter (Signed)
Order placed for US

## 2019-03-09 NOTE — Telephone Encounter (Signed)
error 

## 2019-03-22 ENCOUNTER — Other Ambulatory Visit: Payer: Self-pay

## 2019-03-22 DIAGNOSIS — Z20822 Contact with and (suspected) exposure to covid-19: Secondary | ICD-10-CM

## 2019-03-23 LAB — NOVEL CORONAVIRUS, NAA: SARS-CoV-2, NAA: NOT DETECTED

## 2019-03-29 ENCOUNTER — Ambulatory Visit (INDEPENDENT_AMBULATORY_CARE_PROVIDER_SITE_OTHER): Payer: BC Managed Care – PPO | Admitting: Obstetrics and Gynecology

## 2019-03-29 ENCOUNTER — Other Ambulatory Visit: Payer: Self-pay

## 2019-03-29 ENCOUNTER — Encounter: Payer: Self-pay | Admitting: Obstetrics and Gynecology

## 2019-03-29 ENCOUNTER — Ambulatory Visit (INDEPENDENT_AMBULATORY_CARE_PROVIDER_SITE_OTHER): Payer: BC Managed Care – PPO

## 2019-03-29 VITALS — BP 112/62 | HR 62 | Ht 63.0 in | Wt 138.0 lb

## 2019-03-29 DIAGNOSIS — D251 Intramural leiomyoma of uterus: Secondary | ICD-10-CM

## 2019-03-29 DIAGNOSIS — N9419 Other specified dyspareunia: Secondary | ICD-10-CM

## 2019-03-29 DIAGNOSIS — Z1239 Encounter for other screening for malignant neoplasm of breast: Secondary | ICD-10-CM

## 2019-03-29 DIAGNOSIS — Z1321 Encounter for screening for nutritional disorder: Secondary | ICD-10-CM

## 2019-03-29 DIAGNOSIS — Z01419 Encounter for gynecological examination (general) (routine) without abnormal findings: Secondary | ICD-10-CM | POA: Diagnosis not present

## 2019-03-29 DIAGNOSIS — D252 Subserosal leiomyoma of uterus: Secondary | ICD-10-CM | POA: Diagnosis not present

## 2019-03-29 DIAGNOSIS — N952 Postmenopausal atrophic vaginitis: Secondary | ICD-10-CM

## 2019-03-29 DIAGNOSIS — Z131 Encounter for screening for diabetes mellitus: Secondary | ICD-10-CM

## 2019-03-29 DIAGNOSIS — R300 Dysuria: Secondary | ICD-10-CM

## 2019-03-29 DIAGNOSIS — Z13220 Encounter for screening for lipoid disorders: Secondary | ICD-10-CM

## 2019-03-29 DIAGNOSIS — N3001 Acute cystitis with hematuria: Secondary | ICD-10-CM | POA: Diagnosis not present

## 2019-03-29 DIAGNOSIS — Z1329 Encounter for screening for other suspected endocrine disorder: Secondary | ICD-10-CM

## 2019-03-29 LAB — POCT URINALYSIS DIPSTICK
Bilirubin, UA: NEGATIVE
Glucose, UA: NEGATIVE
Ketones, UA: NEGATIVE
Nitrite, UA: NEGATIVE
Protein, UA: POSITIVE — AB
Spec Grav, UA: 1.025 (ref 1.010–1.025)
Urobilinogen, UA: 0.2 E.U./dL
pH, UA: 6 (ref 5.0–8.0)

## 2019-03-29 MED ORDER — ESTROGENS, CONJUGATED 0.625 MG/GM VA CREA: TOPICAL_CREAM | VAGINAL | 0 refills | Status: DC

## 2019-03-29 MED ORDER — NITROFURANTOIN MONOHYD MACRO 100 MG PO CAPS: 100.0000 mg | ORAL_CAPSULE | Freq: Two times a day (BID) | ORAL | 0 refills | Status: AC

## 2019-03-29 NOTE — Progress Notes (Signed)
Gynecology Annual Exam  PCP: Adin Hector, MD  Chief Complaint:  Chief Complaint  Patient presents with  . Gynecologic Exam    GYN ultrasound ?fibroids  . Urinary Tract Infection    History of Present Illness:Patient is a 58 y.o. G2P2 presents for annual exam. The patient has no complaints today.   LMP: No LMP recorded. Patient is postmenopausal.  The patient is sexually active. She has dyspareunia.  The patient does perform self breast exams.  There is no notable family history of breast or ovarian cancer in her family in any first degree relatives.  Maternal grandmother's sister with breast cancer  The patient wears seatbelts: yes.   The patient has regular exercise: not asked.    The patient denies current symptoms of depression.     Review of Systems: Review of Systems  Constitutional: Negative for chills and fever.       Hot flashes  HENT: Negative for congestion.   Respiratory: Negative for cough and shortness of breath.   Cardiovascular: Negative for chest pain and palpitations.  Gastrointestinal: Negative for abdominal pain, constipation, diarrhea, heartburn, nausea and vomiting.  Genitourinary: Positive for dysuria. Negative for frequency and urgency.       Pain with intercourse  Skin: Negative for itching and rash.  Neurological: Negative for dizziness and headaches.  Endo/Heme/Allergies: Negative for polydipsia.  Psychiatric/Behavioral: Negative for depression.    Past Medical History:  Past Medical History:  Diagnosis Date  . Anxiety   . B12 deficiency   . Hypothyroidism   . Seizures ([place])     Past Surgical History:  Past Surgical History:  Procedure Laterality Date  . COLONOSCOPY    . COLONOSCOPY WITH PROPOFOL N/A 01/10/2016   Procedure: COLONOSCOPY WITH PROPOFOL;  Surgeon: Manya Silvas, MD;  Location: [facility] ENDOSCOPY;  Service: Endoscopy;  Laterality: N/A;  . eye lift    . WISDOM TOOTH EXTRACTION      Gynecologic History:  No LMP  recorded. Patient is postmenopausal. Last Pap: Results were: 12/30/2016 NIL and HR HPV negative  Last mammogram: 02/11/2019 Results were: BI-RAD I  Obstetric History: G2P2  Family History:  Family History  Problem Relation Age of Onset  . Hypercholesterolemia Mother   . Melanoma Mother        Skin, malignant  . Alzheimer's disease Father   . Colon polyps Father   . Hypertension Father   . Stroke Father   . Breast cancer Other     Social History:  Social History   Socioeconomic History  . Marital status: Married    Spouse name: Not on file  . Number of children: Not on file  . Years of education: Not on file  . Highest education level: Not on file  Occupational History  . Not on file  Social Needs  . Financial resource strain: Not on file  . Food insecurity    Worry: Not on file    Inability: Not on file  . Transportation needs    Medical: Not on file    Non-medical: Not on file  Tobacco Use  . Smoking status: Never Smoker  . Smokeless tobacco: Never Used  Substance and Sexual Activity  . Alcohol use: No  . Drug use: No  . Sexual activity: Yes    Birth control/protection: None  Lifestyle  . Physical activity    Days per week: Not on file    Minutes per session: Not on file  . Stress: Not  on file  Relationships  . Social Herbalist on phone: Not on file    Gets together: Not on file    Attends religious service: Not on file    Active member of club or organization: Not on file    Attends meetings of clubs or organizations: Not on file    Relationship status: Not on file  . Intimate partner violence    Fear of current or ex partner: Not on file    Emotionally abused: Not on file    Physically abused: Not on file    Forced sexual activity: Not on file  Other Topics Concern  . Not on file  Social History Narrative  . Not on file    Allergies:  No Known Allergies  Medications: Prior to Admission medications   Medication Sig Start Date End  Date Taking? Authorizing Provider  atorvastatin (LIPITOR) 20 MG tablet Take 20 mg by mouth daily.    [provider]  cholecalciferol (VITAMIN D) 1000 units tablet Take 1,000 Units by mouth daily.    [provider]  escitalopram (LEXAPRO) 10 MG tablet Take by mouth. 07/24/16   [provider]  levothyroxine (SYNTHROID, LEVOTHROID) 75 MCG tablet Take 75 mcg by mouth daily before breakfast.    [provider]    Physical Exam Vitals: Blood pressure 112/62, pulse 62, height 5\' 3"  (1.6 m), weight 138 lb (62.6 kg).  General: NAD HEENT: normocephalic, anicteric Thyroid: no enlargement, no palpable nodules Pulmonary: No increased work of breathing, CTAB Cardiovascular: RRR, distal pulses 2+ Breast: Breast symmetrical, no tenderness, no palpable nodules or masses, no skin or nipple retraction present, no nipple discharge.  No axillary or supraclavicular lymphadenopathy. Abdomen: NABS, soft, non-tender, non-distended.  Umbilicus without lesions.  No hepatomegaly, splenomegaly or masses palpable. No evidence of hernia  Genitourinary:  External: Normal external female genitalia.  Normal urethral meatus, normal Bartholin's and Skene's glands.    Vagina: Normal vaginal mucosa, no evidence of prolapse.    Cervix: Grossly normal in appearance, no bleeding  Uterus: Non-enlarged, mobile, normal contour.  No CMT  Adnexa: ovaries non-enlarged, no adnexal masses  Rectal: deferred  Lymphatic: no evidence of inguinal lymphadenopathy Extremities: no edema, erythema, or tenderness Neurologic: Grossly intact Psychiatric: mood appropriate, affect full  Female chaperone present for pelvic and breast  portions of the physical exam  US Pelvis Transvaginal Non-ob (tv Only)  Result Date: 03/29/2019 Patient Name: Chandler Mang DOB: 1960/10/18 MRN: [id number] ULTRASOUND REPORT Location: [facility] Date of Service: 03/29/2019 Indications:fibroids Findings: The uterus is  anteverted and measures 8.2 x 6.6 x 5.0 cm. Echo texture is heterogenous with evidence of focal masses. Within the uterus are multiple suspected fibroids measuring: Fibroid 1:42.3 x 35.9 x 36.1 mm. Intramural vs. Less than 50% submucosal posterior. This fibroid pushes the endometrium anteriorly. Fibroid 2:12.1 x 8.5 x 13.3 subserosal left Fibroid 3: 29.8 x 24.9 x 27.1 mm subserosal right The Endometrium measures 1.7 mm. Right Ovary measures 3.0 x 1.3 x 2.0 cm. It is normal in appearance. Left Ovary measures 1.8 x 1.3 x 0.7 cm. It is normal in appearance. Survey of the adnexa demonstrates no adnexal masses. There is no free fluid in the cul de sac. Impression: 1. There are three uterine fibroids seen. Recommendations: 1.Clinical correlation with the patient's History and Physical Exam. Gweneth Dimitri, RT Images reviewed.  Stable to slight interval decrease in size of uterine fibroids.  Otherwise normal GYN study without other visualized pathology.  Malachy Mood, MD, [facility], [facility] 03/29/2019, 3:08 PM      Assessment: 58 y.o. G2P2 routine annual exam  Plan: Problem List Items Addressed This Visit    None    Visit Diagnoses    Acute cystitis with hematuria    -  Primary   Relevant Orders   POCT urinalysis dipstick (Completed)   Urine Culture   Dysuria       Relevant Orders   Urine Culture   Lipid screening       Relevant Orders   CMP14+LP+TP+TSH+CBC/Plt   Screening for diabetes mellitus       Relevant Orders   CMP14+LP+TP+TSH+CBC/Plt   Thyroid disorder screening       Relevant Orders   CMP14+LP+TP+TSH+CBC/Plt   Encounter for vitamin deficiency screening       Relevant Orders   Vitamin D (25 hydroxy)   Breast screening       Encounter for gynecological examination without abnormal finding       Intramural leiomyoma of uterus       Vaginal atrophy       Dyspareunia due to medical condition in female          1) Mammogram - recommend yearly  screening mammogram.  Mammogram Is up to date  2) STI screening  was notoffered and therefore not obtained  3) ASCCP guidelines and rational discussed.  Patient opts for every 3 years screening interval  4) Osteoporosis  - per USPTF routine screening DEXA at age 68  5) Routine healthcare maintenance including cholesterol, diabetes screening discussed managed by PCP  6) Colonoscopy  - 01/10/2016  7) Dysuria - Rx Macrobid, culture sent based on UA findings.  Prior urine cultures have returned with pansensitive E. Coli  8) Dysparunia - Rx premarin vaginal cream  9) Uterine fibroids - stable to slightly decreased in size  10)  Return in about 3 months (around 06/29/2019) for medication follow up phone visit.    Malachy Mood, MD [facility], [facility] 03/29/2019, 3:07 PM

## 2019-03-30 LAB — CMP14+LP+TP+TSH+CBC/PLT
ALT: 13 IU/L (ref 0–32)
AST: 15 IU/L (ref 0–40)
Albumin/Globulin Ratio: 1.7 (ref 1.2–2.2)
Albumin: 4.3 g/dL (ref 3.8–4.9)
Alkaline Phosphatase: 59 IU/L (ref 39–117)
BUN/Creatinine Ratio: 22 (ref 9–23)
BUN: 16 mg/dL (ref 6–24)
Bilirubin Total: 0.3 mg/dL (ref 0.0–1.2)
CO2: 22 mmol/L (ref 20–29)
Calcium: 9.5 mg/dL (ref 8.7–10.2)
Chloride: 102 mmol/L (ref 96–106)
Cholesterol, Total: 187 mg/dL (ref 100–199)
Creatinine, Ser: 0.73 mg/dL (ref 0.57–1.00)
Free Thyroxine Index: 1.8 (ref 1.2–4.9)
GFR calc Af Amer: 105 mL/min/{1.73_m2} (ref >59)
GFR calc non Af Amer: 91 mL/min/{1.73_m2} (ref >59)
Globulin, Total: 2.5 g/dL (ref 1.5–4.5)
Glucose: 83 mg/dL (ref 65–99)
HDL: 68 mg/dL (ref >39)
Hematocrit: 39.8 % (ref 34.0–46.6)
Hemoglobin: 13.6 g/dL (ref 11.1–15.9)
LDL Chol Calc (NIH): 93 mg/dL (ref 0–99)
LDL/HDL Ratio: 1.4 ratio (ref 0.0–3.2)
MCH: 31.3 pg (ref 26.6–33.0)
MCHC: 34.2 g/dL (ref 31.5–35.7)
MCV: 92 fL (ref 79–97)
Platelets: 235 10*3/uL (ref 150–450)
Potassium: 4.1 mmol/L (ref 3.5–5.2)
RBC: 4.35 x10E6/uL (ref 3.77–5.28)
RDW: 12.8 % (ref 11.7–15.4)
Sodium: 138 mmol/L (ref 134–144)
T3 Uptake Ratio: 29 % (ref 24–39)
T4, Total: 6.3 ug/dL (ref 4.5–12.0)
TSH: 2.34 u[IU]/mL (ref 0.450–4.500)
Total Protein: 6.8 g/dL (ref 6.0–8.5)
Triglycerides: 151 mg/dL — ABNORMAL HIGH (ref 0–149)
VLDL Cholesterol Cal: 26 mg/dL (ref 5–40)
WBC: 6.7 10*3/uL (ref 3.4–10.8)

## 2019-03-30 LAB — VITAMIN D 25 HYDROXY (VIT D DEFICIENCY, FRACTURES): Vit D, 25-Hydroxy: 46.1 ng/mL (ref 30.0–100.0)

## 2019-03-31 LAB — URINE CULTURE

## 2019-07-25 ENCOUNTER — Ambulatory Visit: Payer: BC Managed Care – PPO | Attending: Internal Medicine

## 2019-07-25 DIAGNOSIS — Z23 Encounter for immunization: Secondary | ICD-10-CM | POA: Insufficient documentation

## 2019-07-25 NOTE — Progress Notes (Signed)
   Covid-19 Vaccination Clinic  Name:  Chinelo Hemric    MRN: [id number] DOB: 03/04/1961  07/25/2019  Ms. Grierson was observed post Covid-19 immunization for 15 minutes without incidence. She was provided with Vaccine Information Sheet and instruction to access the V-Safe system.   Ms. Joynt was instructed to call 911 with any severe reactions post vaccine: Marland Kitchen Difficulty breathing  . Swelling of your face and throat  . A fast heartbeat  . A bad rash all over your body  . Dizziness and weakness    Immunizations Administered    Name Date Dose VIS Date Route   Pfizer COVID-19 Vaccine 07/25/2019  8:29 AM 0.3 mL 05/21/2019 Intramuscular   Manufacturer: [place]   Lot: [id number]   [employer]: [id number]

## 2019-08-17 ENCOUNTER — Ambulatory Visit: Payer: BC Managed Care – PPO | Attending: Internal Medicine

## 2019-08-17 DIAGNOSIS — Z23 Encounter for immunization: Secondary | ICD-10-CM | POA: Insufficient documentation

## 2019-08-17 NOTE — Progress Notes (Signed)
   Covid-19 Vaccination Clinic  Name:  Jackie Fleming    MRN: [id number] DOB: December 09, 1960  08/17/2019  [name] was observed post Covid-19 immunization for 15 minutes without incident. She was provided with Vaccine Information Sheet and instruction to access the V-Safe system.   [name] was instructed to call 911 with any severe reactions post vaccine: Marland Kitchen Difficulty breathing  . Swelling of face and throat  . A fast heartbeat  . A bad rash all over body  . Dizziness and weakness   Immunizations Administered    Name Date Dose VIS Date Route   Pfizer COVID-19 Vaccine 08/17/2019  3:34 PM 0.3 mL 05/21/2019 Intramuscular   Manufacturer: [place]   Lot: [id number]   [place]: [id number]

## 2019-10-18 ENCOUNTER — Telehealth: Payer: Self-pay | Admitting: Obstetrics and Gynecology

## 2019-10-18 NOTE — Telephone Encounter (Signed)
Advise

## 2019-10-21 ENCOUNTER — Other Ambulatory Visit: Payer: Self-pay | Admitting: Obstetrics and Gynecology

## 2019-10-21 ENCOUNTER — Other Ambulatory Visit: Payer: Self-pay

## 2019-10-21 MED ORDER — PREMARIN 0.625 MG/GM VA CREA: TOPICAL_CREAM | VAGINAL | 3 refills | Status: DC

## 2019-10-26 NOTE — Telephone Encounter (Signed)
Patient states she went out of town on Thursday. She has returned and has not picked up premarin from [facility]. She is returning the call from Thursday asking her to call before filling it. [phone]

## 2019-10-27 ENCOUNTER — Other Ambulatory Visit: Payer: Self-pay | Admitting: Obstetrics and Gynecology

## 2019-10-27 MED ORDER — ESTRADIOL 0.1 MG/GM VA CREA: 1.0000 g | TOPICAL_CREAM | VAGINAL | 3 refills | Status: DC

## 2019-10-27 NOTE — Telephone Encounter (Signed)
Changed to estrace.

## 2019-10-27 NOTE — Telephone Encounter (Signed)
Patient rcvd Kim's message. She was unable to get thru on the [facility] phone#. She does need a new rx from [facility]. The premarin was going to be $400 per tube. Patient awaiting alternate rx. [id number]

## 2019-10-27 NOTE — Telephone Encounter (Signed)
Please look in your folders for pt's list of alternate formulary medications

## 2019-10-27 NOTE — Telephone Encounter (Signed)
PA request was received. Insurance will not pay for Premarin, other formulary medications are available and [facility] is aware. Will await his response.  Pt is aware of this via voicemail.

## 2019-10-27 NOTE — Telephone Encounter (Signed)
Pt is aware via voicemail. 

## 2020-12-26 ENCOUNTER — Other Ambulatory Visit: Payer: Self-pay | Admitting: Internal Medicine

## 2020-12-26 DIAGNOSIS — Z1231 Encounter for screening mammogram for malignant neoplasm of breast: Secondary | ICD-10-CM

## 2021-01-02 ENCOUNTER — Other Ambulatory Visit: Payer: Self-pay

## 2021-01-02 ENCOUNTER — Ambulatory Visit
Admission: RE | Admit: 2021-01-02 | Discharge: 2021-01-02 | Disposition: A | Discharge status: Home / Self Care | Payer: Self-pay | Source: Ambulatory Visit | Attending: Internal Medicine | Admitting: Internal Medicine

## 2021-01-02 DIAGNOSIS — Z1231 Encounter for screening mammogram for malignant neoplasm of breast: Secondary | ICD-10-CM | POA: Insufficient documentation

## 2021-09-18 LAB — RESULTS CONSOLE HPV: CHL HPV: NEGATIVE

## 2021-09-18 LAB — HM PAP SMEAR: HM Pap smear: NEGATIVE

## 2022-03-27 IMAGING — MG MM DIGITAL SCREENING BILAT W/ TOMO AND CAD
8 series · 8 of 24 positions shown · non-contrast
Comparison: Previous exam(s).

CLINICAL DATA: Screening.

EXAM:
DIGITAL SCREENING BILATERAL MAMMOGRAM WITH TOMOSYNTHESIS AND CAD
TECHNIQUE: Bilateral screening digital craniocaudal and mediolateral oblique
mammograms were obtained. Bilateral screening digital breast
tomosynthesis was performed. The images were evaluated with
computer-aided detection.

[L CC synth-2D]
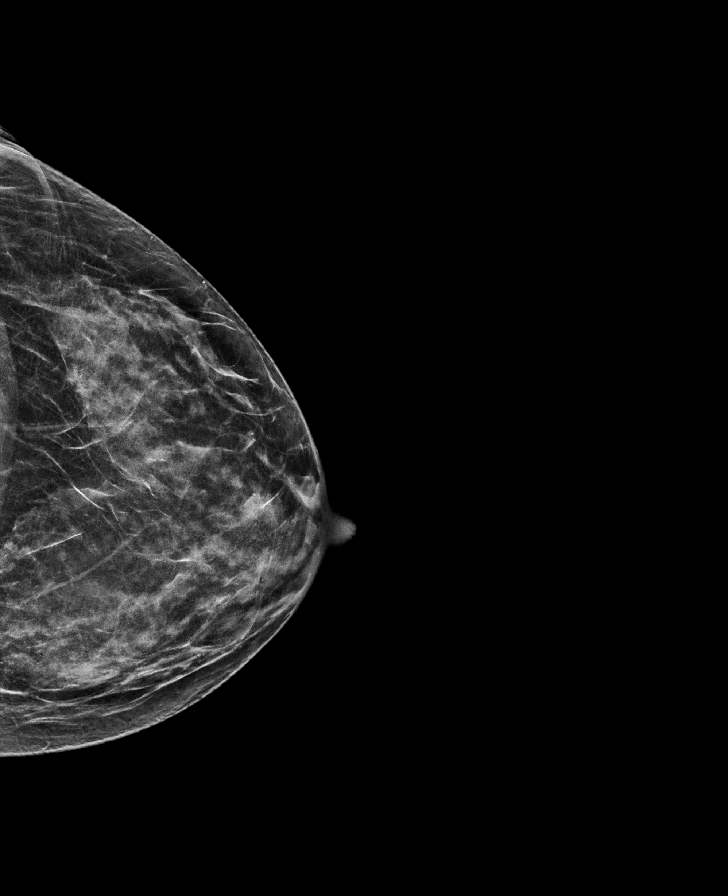

[R MLO synth-2D]
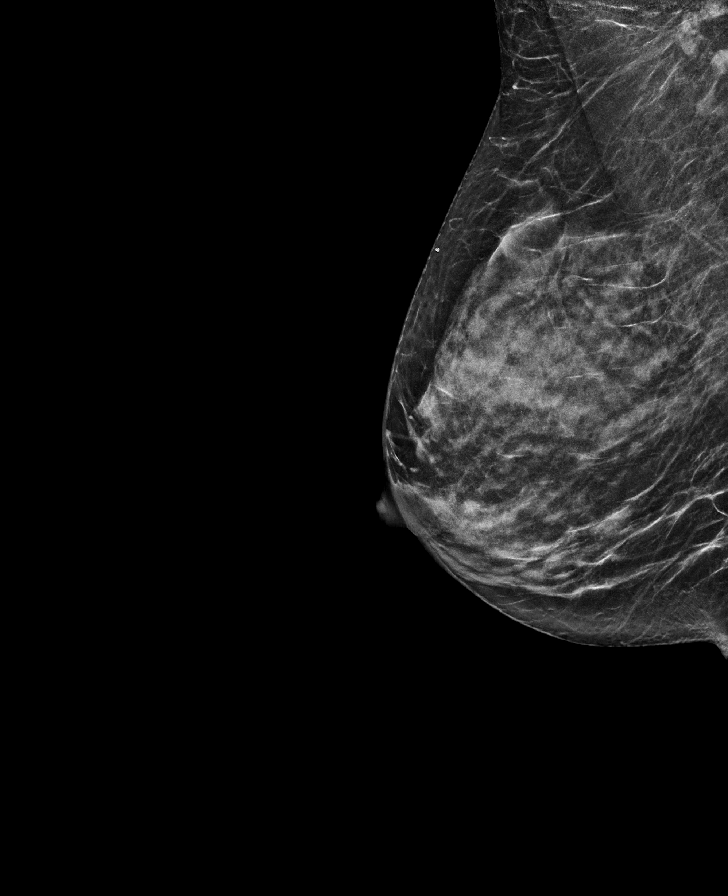

[L MLO synth-2D]
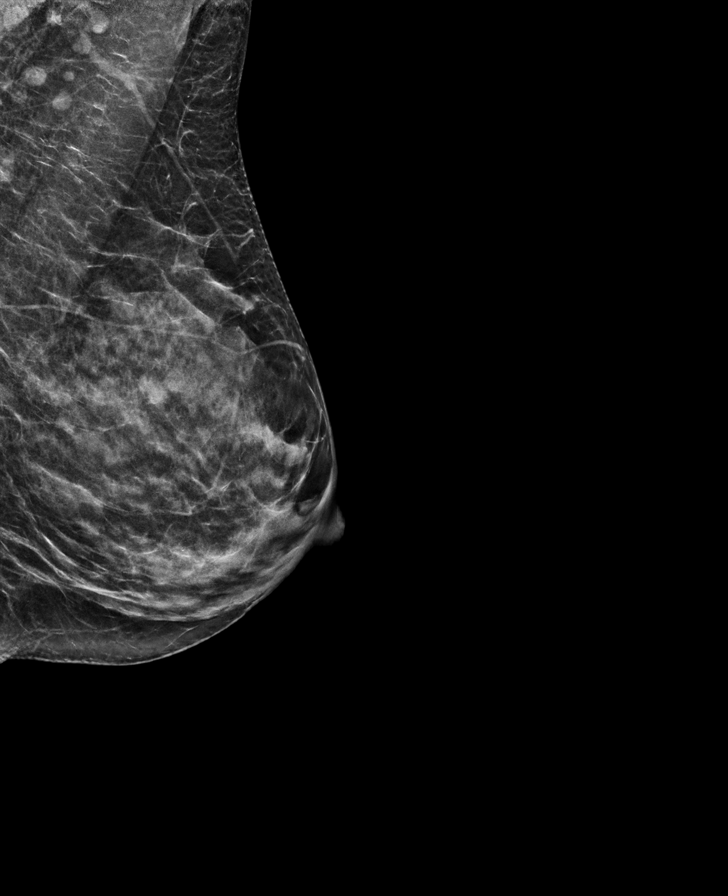

[R CC synth-2D]
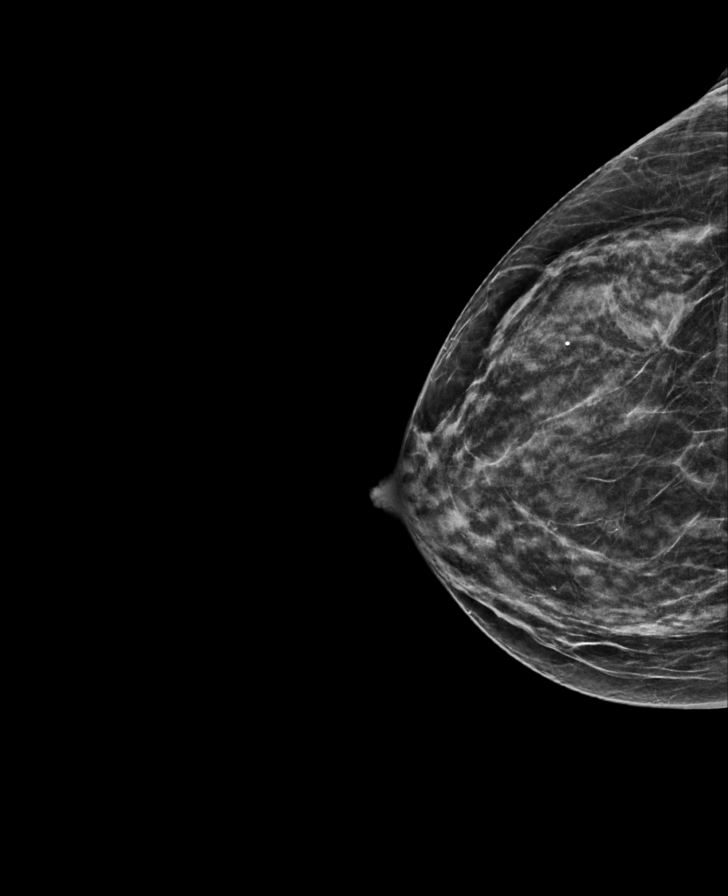

[L MLO tomo · tomo slice 27/53.0]
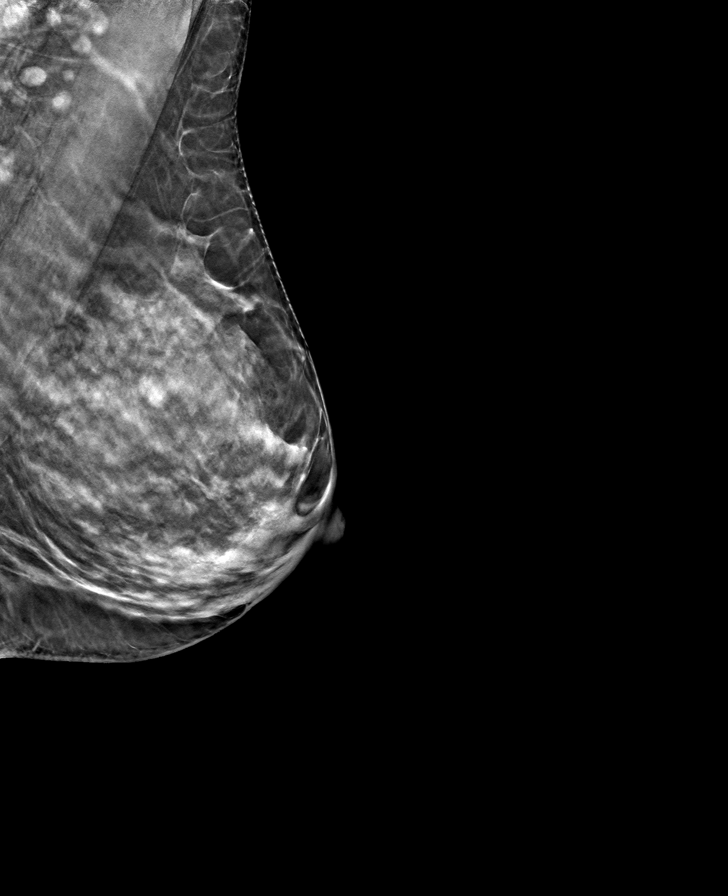

[L CC tomo · tomo slice 27/52.0]
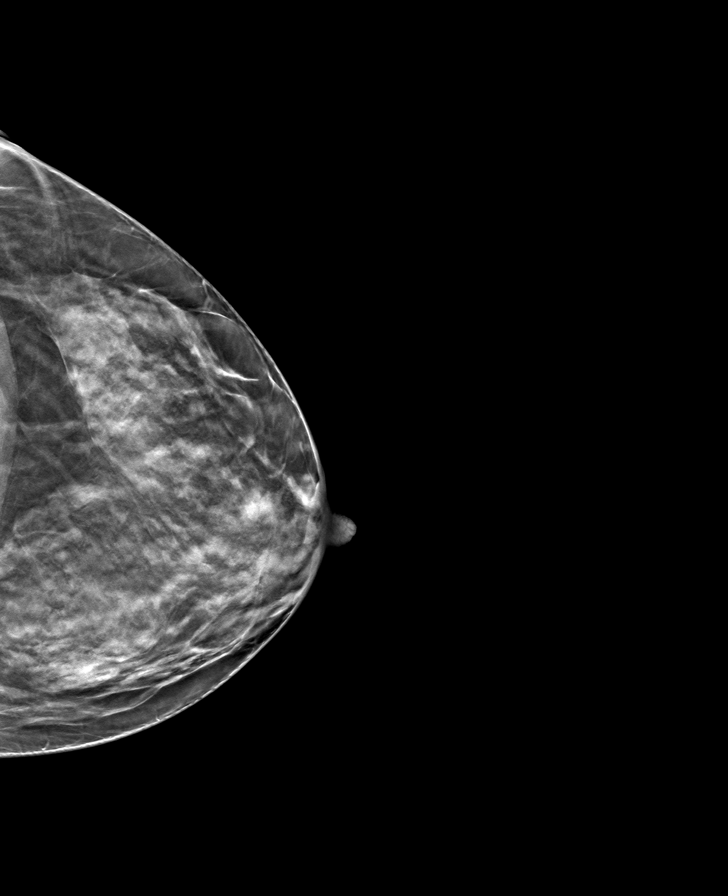

[R CC tomo · tomo slice 25/49.0]
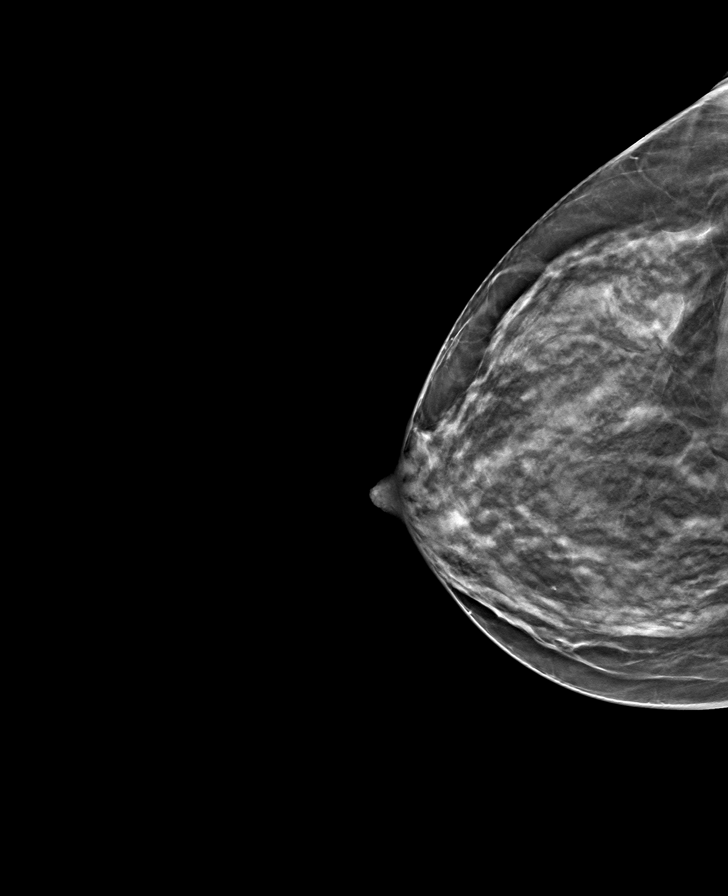

[R MLO tomo · tomo slice 28/55.0]
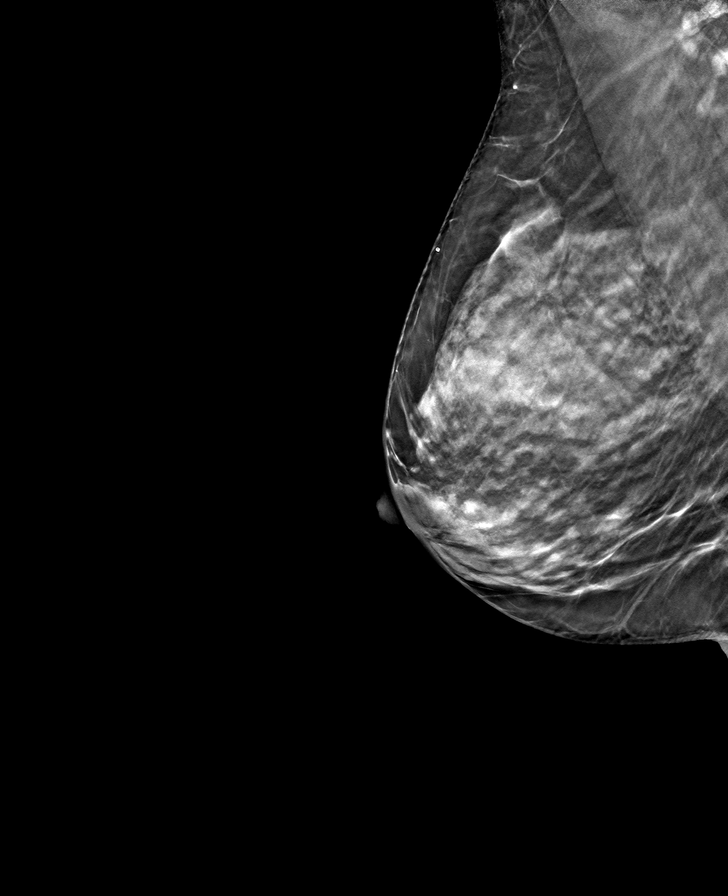

[8 of 24 positions shown; findings below may reference images not displayed]

ACR Breast Density Category c: The breast tissue is heterogeneously
dense, which may obscure small masses.
FINDINGS: There are no findings suspicious for malignancy.
IMPRESSION: No mammographic evidence of malignancy. A result letter of this
screening mammogram will be mailed directly to the patient.

RECOMMENDATION:
Screening mammogram in one year. (Code:Q3-W-BC3)

BI-RADS CATEGORY  1: Negative.

## 2022-06-04 ENCOUNTER — Other Ambulatory Visit: Payer: Self-pay | Admitting: Internal Medicine

## 2022-06-04 DIAGNOSIS — Z1231 Encounter for screening mammogram for malignant neoplasm of breast: Secondary | ICD-10-CM

## 2022-06-12 ENCOUNTER — Ambulatory Visit
Admission: RE | Admit: 2022-06-12 | Discharge: 2022-06-12 | Disposition: A | Discharge status: Home / Self Care | Payer: BC Managed Care – PPO | Source: Ambulatory Visit | Attending: Internal Medicine | Admitting: Internal Medicine

## 2022-06-12 DIAGNOSIS — Z1231 Encounter for screening mammogram for malignant neoplasm of breast: Secondary | ICD-10-CM | POA: Insufficient documentation

## 2023-06-24 ENCOUNTER — Other Ambulatory Visit: Payer: Self-pay | Admitting: Internal Medicine

## 2023-06-24 DIAGNOSIS — Z1231 Encounter for screening mammogram for malignant neoplasm of breast: Secondary | ICD-10-CM

## 2023-07-02 ENCOUNTER — Ambulatory Visit
Admission: RE | Admit: 2023-07-02 | Discharge: 2023-07-02 | Disposition: A | Discharge status: Home / Self Care | Payer: 59 | Source: Ambulatory Visit | Attending: Internal Medicine | Admitting: Internal Medicine

## 2023-07-02 DIAGNOSIS — Z1231 Encounter for screening mammogram for malignant neoplasm of breast: Secondary | ICD-10-CM | POA: Diagnosis present

## 2023-08-10 NOTE — Progress Notes (Deleted)
 Jackie Ferrier, MD   No chief complaint on file.   HPI:      [name] is a 63 y.o. G2P2 whose LMP was No LMP recorded. Patient is postmenopausal., presents today for ***  Last annual 4/23 wtrih Dr. Jean Rosenthal, has estradiol vag ERT Rx Neg pap/neg HPV DNA 09/18/21  There are no active problems to display for this patient.   Past Surgical History:  Procedure Laterality Date   COLONOSCOPY     COLONOSCOPY WITH PROPOFOL N/A 01/10/2016   Procedure: COLONOSCOPY WITH PROPOFOL;  Surgeon: Scot Jun, MD;  Location: [facility] ENDOSCOPY;  Service: Endoscopy;  Laterality: N/A;   eye lift     WISDOM TOOTH EXTRACTION      Family History  Problem Relation Age of Onset   Hypercholesterolemia Mother    Melanoma Mother        Skin, malignant   Alzheimer's disease Father    Colon polyps Father    Hypertension Father    Stroke Father    Breast cancer Other     Social History   Socioeconomic History   Marital status: Married    Spouse name: Not on file   Number of children: Not on file   Years of education: Not on file   Highest education level: Not on file  Occupational History   Not on file  Tobacco Use   Smoking status: Never   Smokeless tobacco: Never  Vaping Use   Vaping status: Never Used  Substance and Sexual Activity   Alcohol use: No   Drug use: No   Sexual activity: Yes    Birth control/protection: None  Other Topics Concern   Not on file  Social History Narrative   Not on file   Social Drivers of Health   Financial Resource Strain: Low Risk  (10/17/2022)   Received from [facility], [facility]   Overall Financial Resource Strain (CARDIA)    Difficulty of Paying Living Expenses: Not hard at all  Food Insecurity: No Food Insecurity (10/17/2022)   Received from [facility], [facility]   Hunger Vital Sign    Worried About Running Out of Food in the Last Year: Never true    Ran  Out of Food in the Last Year: Never true  Transportation Needs: No Transportation Needs (10/17/2022)   Received from [facility], [facility]   [facility] - Transportation    In the past 12 months, has lack of transportation kept you from medical appointments or from getting medications?: No    Lack of Transportation (Non-Medical): No  Physical Activity: Not on file  Stress: Not on file  Social Connections: Not on file  Intimate Partner Violence: Not on file    Outpatient Medications Prior to Visit  Medication Sig Dispense Refill   atorvastatin (LIPITOR) 20 MG tablet Take 20 mg by mouth daily.     cholecalciferol (VITAMIN D) 1000 units tablet Take 1,000 Units by mouth daily.     escitalopram (LEXAPRO) 10 MG tablet Take by mouth.     estradiol (ESTRACE VAGINAL) 0.1 MG/GM vaginal cream Place 1 g vaginally 3 (three) times a week. 42.5 g 3   levothyroxine (SYNTHROID, LEVOTHROID) 75 MCG tablet Take 75 mcg by mouth daily before breakfast.     No facility-administered medications prior to visit.      ROS:  Review of Systems BREAST: No symptoms   OBJECTIVE:  Vitals:  There were no vitals taken for this visit.  Physical Exam  Results: No results found for this or any previous visit (from the past 24 hours).   Assessment/Plan: No diagnosis found.    No orders of the defined types were placed in this encounter.     No follow-ups on file.  Jackie Fleming B. Mathhew Buysse, PA-C 08/10/2023 9:58 PM

## 2023-08-11 ENCOUNTER — Encounter: Payer: 59 | Admitting: Obstetrics and Gynecology

## 2023-08-11 DIAGNOSIS — N952 Postmenopausal atrophic vaginitis: Secondary | ICD-10-CM

## 2023-09-15 NOTE — Progress Notes (Unsigned)
 Lynnea Ferrier, MD   No chief complaint on file.   HPI:      Jackie Fleming is a 63 y.o. G2P2 whose LMP was No LMP recorded. Patient is postmenopausal., presents today for NP eval   Neg pap 09/18/21 at [facility]  There are no active problems to display for this patient.   Past Surgical History:  Procedure Laterality Date   COLONOSCOPY     COLONOSCOPY WITH PROPOFOL N/A 01/10/2016   Procedure: COLONOSCOPY WITH PROPOFOL;  Surgeon: Scot Jun, MD;  Location: [facility] ENDOSCOPY;  Service: Endoscopy;  Laterality: N/A;   eye lift     WISDOM TOOTH EXTRACTION      Family History  Problem Relation Age of Onset   Hypercholesterolemia Mother    Melanoma Mother        Skin, malignant   Alzheimer's disease Father    Colon polyps Father    Hypertension Father    Stroke Father    Breast cancer Other     Social History   Socioeconomic History   Marital status: Married    Spouse name: Not on file   Number of children: Not on file   Years of education: Not on file   Highest education level: Not on file  Occupational History   Not on file  Tobacco Use   Smoking status: Never   Smokeless tobacco: Never  Vaping Use   Vaping status: Never Used  Substance and Sexual Activity   Alcohol use: No   Drug use: No   Sexual activity: Yes    Birth control/protection: None  Other Topics Concern   Not on file  Social History Narrative   Not on file   Social Drivers of Health   Financial Resource Strain: Low Risk  (10/17/2022)   Received from [facility], [facility]   Overall Financial Resource Strain (CARDIA)    Difficulty of Paying Living Expenses: Not hard at all  Food Insecurity: No Food Insecurity (10/17/2022)   Received from [facility], [facility]   Hunger Vital Sign    Worried About Running Out of Food in the Last Year: Never true    Ran Out of Food in the Last Year: Never true  Transportation  Needs: No Transportation Needs (10/17/2022)   Received from [facility], [facility]   [facility] - Transportation    In the past 12 months, has lack of transportation kept you from medical appointments or from getting medications?: No    Lack of Transportation (Non-Medical): No  Physical Activity: Not on file  Stress: Not on file  Social Connections: Not on file  Intimate Partner Violence: Not on file    Outpatient Medications Prior to Visit  Medication Sig Dispense Refill   atorvastatin (LIPITOR) 20 MG tablet Take 20 mg by mouth daily.     cholecalciferol (VITAMIN D) 1000 units tablet Take 1,000 Units by mouth daily.     escitalopram (LEXAPRO) 10 MG tablet Take by mouth.     estradiol (ESTRACE VAGINAL) 0.1 MG/GM vaginal cream Place 1 g vaginally 3 (three) times a week. 42.5 g 3   levothyroxine (SYNTHROID, LEVOTHROID) 75 MCG tablet Take 75 mcg by mouth daily before breakfast.     No facility-administered medications prior to visit.      ROS:  Review of Systems BREAST: No symptoms   OBJECTIVE:   Vitals:  There were no vitals  taken for this visit.  Physical Exam  Results: No results found for this or any previous visit (from the past 24 hours).   Assessment/Plan: No diagnosis found.    No orders of the defined types were placed in this encounter.     No follow-ups on file.  Darya Bigler B. Tavi Hoogendoorn, PA-C 09/15/2023 4:43 PM

## 2023-09-16 ENCOUNTER — Ambulatory Visit: Admitting: Obstetrics and Gynecology

## 2023-10-03 ENCOUNTER — Ambulatory Visit: Admitting: Obstetrics and Gynecology

## 2023-10-03 ENCOUNTER — Encounter: Payer: Self-pay | Admitting: Obstetrics and Gynecology

## 2023-10-03 VITALS — BP 105/69 | HR 64 | Ht 63.5 in | Wt 131.0 lb

## 2023-10-03 DIAGNOSIS — R6882 Decreased libido: Secondary | ICD-10-CM | POA: Diagnosis not present

## 2023-10-03 DIAGNOSIS — N952 Postmenopausal atrophic vaginitis: Secondary | ICD-10-CM | POA: Diagnosis not present

## 2023-10-03 DIAGNOSIS — N941 Unspecified dyspareunia: Secondary | ICD-10-CM | POA: Diagnosis not present

## 2023-10-03 MED ORDER — IMVEXXY STARTER PACK 10 MCG VA INST: VAGINAL_INSERT | VAGINAL | 0 refills | Status: AC

## 2023-10-03 MED ORDER — IMVEXXY MAINTENANCE PACK 10 MCG VA INST: VAGINAL_INSERT | VAGINAL | 1 refills | Status: AC

## 2023-10-03 MED ORDER — AMBULATORY NON FORMULARY MEDICATION: 0 refills | Status: AC

## 2023-10-03 NOTE — Progress Notes (Addendum)
 Jackie Spoon, MD   Chief Complaint  Patient presents with   Vaginal Dryness    Severe dryness    HPI:      [name] is a 63 y.o. G2P2 whose LMP was No LMP recorded. Patient is postmenopausal., presents today for NP vaginal dryness/dyspareunia, no sx if not sexually active. No increased vag d/c, odor. Given vag ERT 4/23 with Dr. Cleora Daft at [facility]; used 5 days wkly for 2 tubes without any sx change. Used lubricants as well without relief. Has scant amt bleeding with sex occas. No PMB. Also has decreased libido/no interest in sex which is upsetting to her.  Pt went through menopause mid 40s, never did HRT, although wanted to. Not given due to concern of breast cancer risk after [facility] results came out. No personal hx of breast cancer .Pt's friends are on the estradiol  patch and told her she needs to try it. Pt doesn't have VS sx.   Past Medical History:  Diagnosis Date   Anxiety    B12 deficiency    Hypothyroidism    Seizures (HCC)    Past Surgical History:  Procedure Laterality Date   COLONOSCOPY     COLONOSCOPY WITH PROPOFOL  N/A 01/10/2016   Procedure: COLONOSCOPY WITH PROPOFOL ;  Surgeon: Cassie Click, MD;  Location: [facility] ENDOSCOPY;  Service: Endoscopy;  Laterality: N/A;   eye lift     WISDOM TOOTH EXTRACTION      Family History  Problem Relation Age of Onset   Hypercholesterolemia Mother    Melanoma Mother        Skin, malignant   Alzheimer's disease Father    Colon polyps Father    Hypertension Father    Stroke Father    Breast cancer Other 52    Social History   Socioeconomic History   Marital status: Married    Spouse name: Not on file   Number of children: Not on file   Years of education: Not on file   Highest education level: Not on file  Occupational History   Not on file  Tobacco Use   Smoking status: Never   Smokeless tobacco: Never  Vaping Use   Vaping status: Never Used  Substance and Sexual Activity   Alcohol use: No   Drug use:  No   Sexual activity: Yes    Birth control/protection: Post-menopausal  Other Topics Concern   Not on file  Social History Narrative   Not on file   Social Drivers of Health   Financial Resource Strain: Low Risk  (10/17/2022)   Received from [facility], [facility]   Overall Financial Resource Strain (CARDIA)    Difficulty of Paying Living Expenses: Not hard at all  Food Insecurity: No Food Insecurity (10/17/2022)   Received from [facility], [facility]   Hunger Vital Sign    Worried About Running Out of Food in the Last Year: Never true    Ran Out of Food in the Last Year: Never true  Transportation Needs: No Transportation Needs (10/17/2022)   Received from [facility], [facility]   [facility] - Transportation    In the past 12 months, has lack of transportation kept you from medical appointments or from getting medications?: No    Lack of Transportation (Non-Medical): No  Physical Activity: Not on file  Stress: Not on file  Social Connections: Not on file  Intimate Partner Violence:  Not on file    Outpatient Medications Prior to Visit  Medication Sig Dispense Refill   atorvastatin (LIPITOR) 20 MG tablet Take 20 mg by mouth daily.     azelastine (ASTELIN) 0.1 % nasal spray Place 1 spray into the nose.     cholecalciferol (VITAMIN D ) 1000 units tablet Take 1,000 Units by mouth daily.     Cyanocobalamin 1000 MCG/ML LIQD Take by mouth.     escitalopram (LEXAPRO) 10 MG tablet Take by mouth.     fluticasone (FLONASE) 50 MCG/ACT nasal spray Place 2 sprays into the nose.     levothyroxine (SYNTHROID) 88 MCG tablet Take 88 mcg by mouth daily.     Multiple Vitamin (MULTI-VITAMIN) tablet Take 1 tablet by mouth daily.     estradiol  (ESTRACE  VAGINAL) 0.1 MG/GM vaginal cream Place 1 g vaginally 3 (three) times a week. 42.5 g 3   levothyroxine (SYNTHROID, LEVOTHROID) 75 MCG tablet Take  75 mcg by mouth daily before breakfast.     No facility-administered medications prior to visit.      ROS:  Review of Systems  Constitutional:  Negative for fever.  Gastrointestinal:  Negative for blood in stool, constipation, diarrhea, nausea and vomiting.  Genitourinary:  Positive for dyspareunia. Negative for dysuria, flank pain, frequency, hematuria, urgency, vaginal bleeding, vaginal discharge and vaginal pain.  Musculoskeletal:  Negative for back pain.  Skin:  Negative for rash.   BREAST: No symptoms   OBJECTIVE:   Vitals:  BP 105/69   Pulse 64   Ht 5' 3.5" (1.613 m)   Wt 131 lb (59.4 kg)   BMI 22.84 kg/m   Physical Exam Vitals reviewed.  Constitutional:      Appearance: She is well-developed.  Pulmonary:     Effort: Pulmonary effort is normal.  Genitourinary:    General: Normal vulva.     Pubic Area: No rash.      Labia:        Right: No rash, tenderness or lesion.        Left: No rash, tenderness or lesion.      Vagina: Normal. No vaginal discharge, erythema or tenderness.     Cervix: Normal.     Uterus: Normal. Not enlarged and not tender.      Adnexa: Right adnexa normal and left adnexa normal.       Right: No mass or tenderness.         Left: No mass or tenderness.       Comments: SMALLER INTROITUS; MOD VAG ATROPHY Musculoskeletal:        General: Normal range of motion.     Cervical back: Normal range of motion.  Skin:    General: Skin is warm and dry.  Neurological:     General: No focal deficit present.     Mental Status: She is alert and oriented to person, place, and time.  Psychiatric:        Mood and Affect: Mood normal.        Behavior: Behavior normal.        Thought Content: Thought content normal.        Judgment: Judgment normal.    Assessment/Plan: Dyspareunia in female - Plan: Estradiol  Starter Pack (IMVEXXY  STARTER PACK) 10 MCG INST, Estradiol  (IMVEXXY  MAINTENANCE PACK) 10 MCG INST; due to vaginal atrophy. Will try Rx imvexxy   and add testosterone vaginally. If sx persist, will change to vag estriol instead. Pt to RTO in 6 wks.   Vaginal atrophy - Plan:  Estradiol  Starter Pack (IMVEXXY  STARTER PACK) 10 MCG INST, Estradiol  (IMVEXXY  MAINTENANCE PACK) 10 MCG INST  Decreased libido - Plan: AMBULATORY NON FORMULARY MEDICATION; discussed dosage with Josiah Nigh at [facility]. Rx faxed. Pt to use inner thighs for best systemic absorption and pea size amount to vaginal tissue for atrophy. F/u in 6 wks.   Discussed that pt not a candidate for systemic HRT due to not having VS sx, menopause over 10 yrs ago and pt > 59. Pt disappointed but understands risks of starting HRT now.    Meds ordered this encounter  Medications   Estradiol  Starter Pack (IMVEXXY  STARTER PACK) 10 MCG INST    Sig: Insert 1 supp vaginally nightly for 2 wks, then twice wkly    Dispense:  18 each    Refill:  0    Supervising Provider:   Sofia Dunn [[id number]]   Estradiol  (IMVEXXY  MAINTENANCE PACK) 10 MCG INST    Sig: Insert 1 supp vaginally twice wkly    Dispense:  8 each    Refill:  1    Supervising Provider:   Sofia Dunn [[id number]]   AMBULATORY NON FORMULARY MEDICATION    Sig: Medication Name: testosterone 1% cream (10mg /ml) Apply 0.5 ml (pea size to vagina, rest to inner thighs) daily for 6 wks    Dispense:  20 mL    Refill:  0    Supervising Provider:   Sofia Dunn [[id number]]      Return in about 6 weeks (around 11/14/2023) for f/u.  Rosamaria Donn B. Matisse Salais, PA-C 10/03/2023 11:52 AM

## 2023-10-03 NOTE — Patient Instructions (Signed)
 I value your feedback and you entrusting [place] with your care. If you get a [place] patient survey, I would appreciate you taking the time to let us know about your experience today. Thank you! ? ? ?

## 2023-10-16 ENCOUNTER — Telehealth: Payer: Self-pay

## 2023-10-16 NOTE — Telephone Encounter (Signed)
 Per [facility], rep advised to send Rx's to [facility] for cheaper purposes. Reached out to pt, no answer, LVMTRC.

## 2023-10-16 NOTE — Telephone Encounter (Signed)
 Pt called back and stated she has received starter pack and testosterone cream from [facility] and is using them. DISREGARD [facility] request.
